# Patient Record
Sex: Male | Born: 1970 | Race: White | Hispanic: No | Smoking: Current every day smoker
Health system: Southern US, Community
[De-identification: ages and names within clinical notes are randomized; demographics above are authoritative.]

## PROBLEM LIST (undated history)

## (undated) DIAGNOSIS — E119 Type 2 diabetes mellitus without complications: Secondary | ICD-10-CM

## (undated) HISTORY — PX: GASTRIC BYPASS: SHX52

---

## 2006-03-12 ENCOUNTER — Emergency Department (HOSPITAL_COMMUNITY): Admission: EM | Admit: 2006-03-12 | Discharge: 2006-03-12 | Payer: Self-pay | Admitting: Emergency Medicine

## 2013-09-30 ENCOUNTER — Encounter (HOSPITAL_COMMUNITY): Payer: Self-pay | Admitting: Emergency Medicine

## 2013-09-30 ENCOUNTER — Emergency Department (HOSPITAL_COMMUNITY)
Admission: EM | Admit: 2013-09-30 | Discharge: 2013-10-01 | Disposition: A | Discharge status: Home / Self Care | Payer: Medicare Other | Attending: Emergency Medicine | Admitting: Emergency Medicine

## 2013-09-30 DIAGNOSIS — M25579 Pain in unspecified ankle and joints of unspecified foot: Secondary | ICD-10-CM | POA: Diagnosis not present

## 2013-09-30 HISTORY — DX: Type 2 diabetes mellitus without complications: E11.9

## 2013-09-30 NOTE — ED Notes (Signed)
Pt states he burned his feet about a month ago, he is a diabetic, and has open ulcer to bottom of right foot and blister to top of right foot.  Ulcer to bottom of left foot.

## 2013-10-01 NOTE — ED Notes (Signed)
Unable to locate pt in all waiting areas x 3 attempts.  

## 2016-05-14 ENCOUNTER — Emergency Department (HOSPITAL_COMMUNITY)
Admission: EM | Admit: 2016-05-14 | Discharge: 2016-05-14 | Disposition: A | Discharge status: Home / Self Care | Payer: Medicare Other | Attending: Emergency Medicine | Admitting: Emergency Medicine

## 2016-05-14 ENCOUNTER — Encounter (HOSPITAL_COMMUNITY): Payer: Self-pay

## 2016-05-14 ENCOUNTER — Emergency Department (HOSPITAL_COMMUNITY): Payer: Medicare Other

## 2016-05-14 DIAGNOSIS — M79671 Pain in right foot: Secondary | ICD-10-CM

## 2016-05-14 DIAGNOSIS — L84 Corns and callosities: Secondary | ICD-10-CM | POA: Diagnosis not present

## 2016-05-14 DIAGNOSIS — E119 Type 2 diabetes mellitus without complications: Secondary | ICD-10-CM | POA: Insufficient documentation

## 2016-05-14 LAB — CBC WITH DIFFERENTIAL/PLATELET
Basophils Absolute: 0 10*3/uL (ref 0.0–0.1)
Basophils Relative: 0 %
EOS ABS: 0.3 10*3/uL (ref 0.0–0.7)
EOS PCT: 3 %
HCT: 43.9 % (ref 39.0–52.0)
Hemoglobin: 14.7 g/dL (ref 13.0–17.0)
LYMPHS ABS: 3.1 10*3/uL (ref 0.7–4.0)
LYMPHS PCT: 37 %
MCH: 29.1 pg (ref 26.0–34.0)
MCHC: 33.5 g/dL (ref 30.0–36.0)
MCV: 86.8 fL (ref 78.0–100.0)
MONOS PCT: 6 %
Monocytes Absolute: 0.5 10*3/uL (ref 0.1–1.0)
Neutro Abs: 4.6 10*3/uL (ref 1.7–7.7)
Neutrophils Relative %: 54 %
PLATELETS: 307 10*3/uL (ref 150–400)
RBC: 5.06 MIL/uL (ref 4.22–5.81)
RDW: 15 % (ref 11.5–15.5)
WBC: 8.5 10*3/uL (ref 4.0–10.5)

## 2016-05-14 LAB — BASIC METABOLIC PANEL
Anion gap: 12 (ref 5–15)
BUN: 10 mg/dL (ref 6–20)
CHLORIDE: 101 mmol/L (ref 101–111)
CO2: 25 mmol/L (ref 22–32)
CREATININE: 0.71 mg/dL (ref 0.61–1.24)
Calcium: 9 mg/dL (ref 8.9–10.3)
GFR calc Af Amer: >60 mL/min (ref >60)
GLUCOSE: 84 mg/dL (ref 65–99)
POTASSIUM: 3.9 mmol/L (ref 3.5–5.1)
SODIUM: 138 mmol/L (ref 135–145)

## 2016-05-14 MED ORDER — TRAMADOL HCL 50 MG PO TABS: 50.0000 mg | ORAL_TABLET | Freq: Four times a day (QID) | ORAL | 0 refills | Status: DC | PRN

## 2016-05-14 MED ORDER — ONDANSETRON HCL 4 MG PO TABS
4.0000 mg | ORAL_TABLET | Freq: Once | ORAL | Status: AC
  Administered 2016-05-14: 4 mg via ORAL
  Filled 2016-05-14: qty 1

## 2016-05-14 MED ORDER — ACETAMINOPHEN 325 MG PO TABS
650.0000 mg | ORAL_TABLET | Freq: Once | ORAL | Status: AC
  Administered 2016-05-14: 650 mg via ORAL
  Filled 2016-05-14: qty 2

## 2016-05-14 MED ORDER — IBUPROFEN 600 MG PO TABS: 600.0000 mg | ORAL_TABLET | Freq: Four times a day (QID) | ORAL | 0 refills | Status: AC | PRN

## 2016-05-14 MED ORDER — CLINDAMYCIN HCL 150 MG PO CAPS
300.0000 mg | ORAL_CAPSULE | Freq: Once | ORAL | Status: AC
  Administered 2016-05-14: 300 mg via ORAL
  Filled 2016-05-14: qty 2

## 2016-05-14 MED ORDER — IBUPROFEN 800 MG PO TABS
800.0000 mg | ORAL_TABLET | Freq: Once | ORAL | Status: AC
  Administered 2016-05-14: 800 mg via ORAL
  Filled 2016-05-14: qty 1

## 2016-05-14 MED ORDER — CLINDAMYCIN HCL 150 MG PO CAPS: 150.0000 mg | ORAL_CAPSULE | Freq: Four times a day (QID) | ORAL | 0 refills | Status: DC

## 2016-05-14 NOTE — ED Notes (Signed)
Pt reports a place on his plantar surface of his L foot for the last year- he states he moved to [place] from [place] and "lost my referrals" for foot surgery- he reports a backwards "tear drop" that interferes with his walking and reports he has had no sleep in one week  He has a strong odor of alcohol to his breath

## 2016-05-14 NOTE — ED Notes (Signed)
To radiology

## 2016-05-14 NOTE — ED Provider Notes (Signed)
[facility] Provider Note   CSN: [id number] Arrival date & time: 05/14/16  1954     History   Chief Complaint Chief Complaint  Patient presents with  . Foot Pain    HPI Jon Chandler is a 46 y.o. male.  Patient is a 46 year old male who presents to the emergency department with a complaint of left foot pain.  The patient states that this problem is been going on for approximately a year. He has been seen by a specialist in the [place] area. He has moved from the [place] area to the [facility] area and is lost his referrals. He states that he has a cyst or a callus under his left foot. He states the foot has had some swelling. And he now has pain that keeps him up for several nights at the time. He states it's almost like having a [place] hornet stuck in your shoe. He has not had any fever or chills. His been no drainage from the area around the cyst. He states that he just feels as though he cannot take the pain any longer and came to the emergency department tonight for evaluation.  It is of note that the patient has a history of diabetes mellitus. He also has a history of morbid obesity. These conditions have been resolved by gastric bypass surgery.      Past Medical History:  Diagnosis Date  . Diabetes mellitus without complication (HCC)     There are no active problems to display for this patient.   Past Surgical History:  Procedure Laterality Date  . GASTRIC BYPASS         Home Medications    Prior to Admission medications   Medication Sig Start Date End Date Taking? Authorizing Provider  clindamycin (CLEOCIN) 150 MG capsule Take 1 capsule (150 mg total) by mouth every 6 (six) hours. 05/14/16   Ivery Quale, PA-C  ibuprofen (ADVIL,MOTRIN) 600 MG tablet Take 1 tablet (600 mg total) by mouth every 6 (six) hours as needed. 05/14/16   Ivery Quale, PA-C  traMADol (ULTRAM) 50 MG tablet Take 1 tablet (50 mg total) by mouth every 6 (six) hours as  needed. 05/14/16   Ivery Quale, PA-C    Family History No family history on file.  Social History Social History  Substance Use Topics  . Smoking status: Never Smoker  . Smokeless tobacco: Never Used  . Alcohol use Yes     Allergies   Penicillins   Review of Systems Review of Systems  Constitutional: Negative for activity change.       All ROS Neg except as noted in HPI  HENT: Negative for nosebleeds.   Eyes: Negative for photophobia and discharge.  Respiratory: Negative for cough, shortness of breath and wheezing.   Cardiovascular: Negative for chest pain and palpitations.  Gastrointestinal: Negative for abdominal pain and blood in stool.  Genitourinary: Negative for dysuria, frequency and hematuria.  Musculoskeletal: Positive for arthralgias. Negative for back pain and neck pain.       Foot pain  Skin: Negative.   Neurological: Negative for dizziness, seizures and speech difficulty.  Psychiatric/Behavioral: Negative for confusion and hallucinations.     Physical Exam Updated Vital Signs BP 132/56 (BP Location: Left Arm)   Pulse 84   Temp 98 F (36.7 C) (Oral)   Resp 16   Ht 6\' 3"  (1.905 m)   Wt 104.3 kg   SpO2 100%   BMI 28.75 kg/m   Physical Exam  Constitutional: He is oriented to person, place, and time. He appears well-developed and well-nourished.  Non-toxic appearance.  HENT:  Head: Normocephalic.  Right Ear: Tympanic membrane and external ear normal.  Left Ear: Tympanic membrane and external ear normal.  Eyes: EOM and lids are normal. Pupils are equal, round, and reactive to light.  Neck: Normal range of motion. Neck supple. Carotid bruit is not present.  Cardiovascular: Normal rate, regular rhythm, normal heart sounds, intact distal pulses and normal pulses.   Pulmonary/Chest: Breath sounds normal. No respiratory distress.  Abdominal: Soft. Bowel sounds are normal. There is no tenderness. There is no guarding.  Musculoskeletal: Normal range of  motion.  There is a callus under the fourth metatarsal phalangeal joint left foot. It is tender to touch. Examination is limited because patient states it is too painful. The dorsalis pedis pulses 2+. The posterior tibial pulses 2+. The Achilles tendon is intact. Capillary refill is less than 2 seconds. There no lesions noted between the toes. There no red streaks going up the left leg.  Lymphadenopathy:       Head (right side): No submandibular adenopathy present.       Head (left side): No submandibular adenopathy present.    He has no cervical adenopathy.  Neurological: He is alert and oriented to person, place, and time. He has normal strength. No cranial nerve deficit or sensory deficit.  Skin: Skin is warm and dry.  Psychiatric: He has a normal mood and affect. His speech is normal.  Nursing note and vitals reviewed.    ED Treatments / Results  Labs (all labs ordered are listed, but only abnormal results are displayed) Labs Reviewed  CBC WITH DIFFERENTIAL/PLATELET  BASIC METABOLIC PANEL    EKG  EKG Interpretation None       Radiology Dg Foot Complete Left  Result Date: 05/14/2016 CLINICAL DATA:  Chronic LEFT foot pain, now worsening. EXAM: LEFT FOOT - COMPLETE 3+ VIEW COMPARISON:  None. FINDINGS: There is no evidence of fracture or dislocation. There is no evidence of arthropathy or other focal bone abnormality. Soft tissues are unremarkable. IMPRESSION: Negative. Electronically Signed   By: Awilda Metroourtnay  Bloomer M.D.   On: 05/14/2016 21:13    Procedures Procedures (including critical care time)  Medications Ordered in ED Medications  ibuprofen (ADVIL,MOTRIN) tablet 800 mg (800 mg Oral Given 05/14/16 2126)  acetaminophen (TYLENOL) tablet 650 mg (650 mg Oral Given 05/14/16 2126)  clindamycin (CLEOCIN) capsule 300 mg (300 mg Oral Given 05/14/16 2125)  ondansetron (ZOFRAN) tablet 4 mg (4 mg Oral Given 05/14/16 2125)     Initial Impression / Assessment and Plan / ED Course  I have  reviewed the triage vital signs and the nursing notes.  Pertinent labs & imaging results that were available during my care of the patient were reviewed by me and considered in my medical decision making (see chart for details).     **I have reviewed nursing notes, vital signs, and all appropriate lab and imaging results for this patient.*  Final Clinical Impressions(s) / ED Diagnoses MDM Patient reports a one-year of problems with his left foot. He has been seen in the past by podiatry in the [place] area. He has since moved to the [facility] area, and "lost his referrals". The patient states that he is here tonight because the cyst or callous is causing him a great deal of pain. The examination is limited, because the patient will not allow me to touch certain areas of the foot,  particularly the callus area. I see no red streaks appreciated. There is no drainage from the callus area. There is good range of motion of the toes. There is no neurovascular compromise. Complete blood count is well within normal limits. The basic metabolic panel is within normal limits. The x-ray of the left foot shows no evidence of fracture, dislocation, foreign body, gas, or signs of advancing infection.  I've advised the patient to see physicians at [facility]. Prescription for clindamycin and ibuprofen haven't been given to the patient to use. A small dose of Ultram also given to assist him with his pain. We discussed the importance of warm tub soaks to his foot. The patient acknowledges understanding of the referral as well as the medications.    Final diagnoses:  Pre-ulcerative corn or callous  Foot pain, right    New Prescriptions New Prescriptions   CLINDAMYCIN (CLEOCIN) 150 MG CAPSULE    Take 1 capsule (150 mg total) by mouth every 6 (six) hours.   IBUPROFEN (ADVIL,MOTRIN) 600 MG TABLET    Take 1 tablet (600 mg total) by mouth every 6 (six) hours as needed.   TRAMADOL (ULTRAM) 50  MG TABLET    Take 1 tablet (50 mg total) by mouth every 6 (six) hours as needed.     Ivery Quale, PA-C 05/14/16 2143    Eber Hong, MD 05/15/16 1014

## 2016-05-14 NOTE — ED Triage Notes (Addendum)
Patient reports of sore to left foot area for months.   Patient states he has not been prescribed any medications for diabetes in 3 years. States he has frequent episdoes of hypoglycemia since his by-pass surgery.

## 2016-05-14 NOTE — ED Notes (Signed)
Pt reports that a minor child with him is his caregiver 24/7 per his report

## 2016-05-14 NOTE — ED Notes (Signed)
Minor to desk to report that pt is in pain. When asked, she states pt has taken nothing for pain

## 2016-05-14 NOTE — ED Notes (Addendum)
Patient transported to and from radiology. 

## 2016-05-14 NOTE — Discharge Instructions (Signed)
The x-ray of your right foot is negative for signs of infection, or foreign body, or bone abnormality. Your complete blood count and basic metabolic chemistry are both within normal limits. Please soak your foot in warm Epsom salt water for 10-15 minutes daily. Please see Dr. Stacie AcresMayer for additional podiatry evaluation and management of this problem. Please use clindamycin and ibuprofen daily. Please take these medicines with food. Use Ultram for more severe pain. This medication may cause drowsiness, please use with caution. Please keep your foot elevated above your waist is much as possible.

## 2017-08-12 IMAGING — DX DG FOOT COMPLETE 3+V*L*
3 series · 3 of 3 positions shown · non-contrast
Comparison: None.

CLINICAL DATA: Chronic LEFT foot pain, now worsening.

EXAM:
LEFT FOOT - COMPLETE 3+ VIEW

[foot ap]
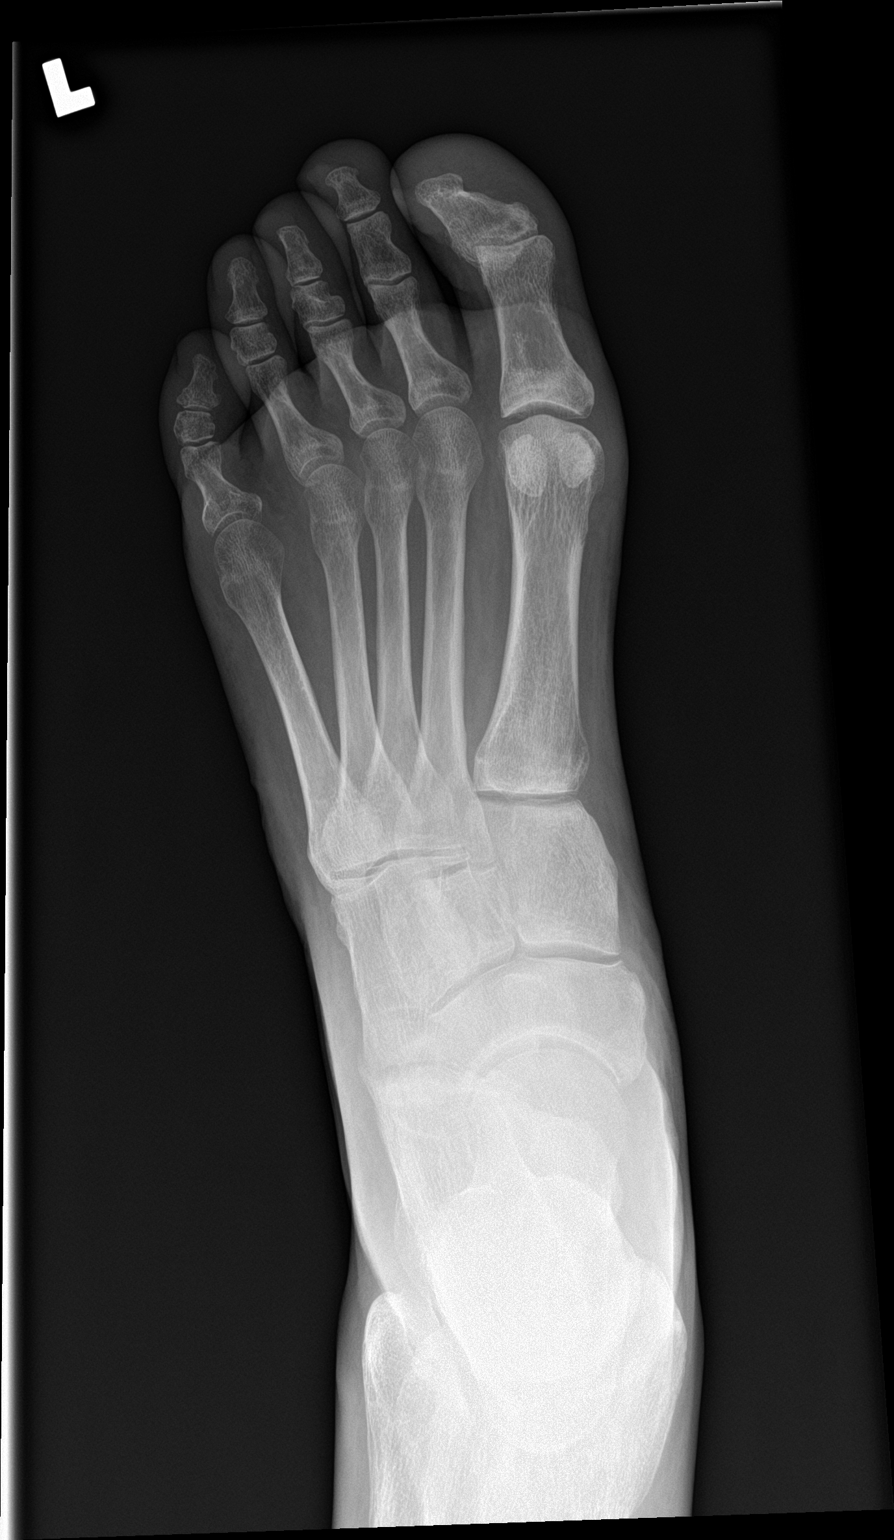

[foot obl]
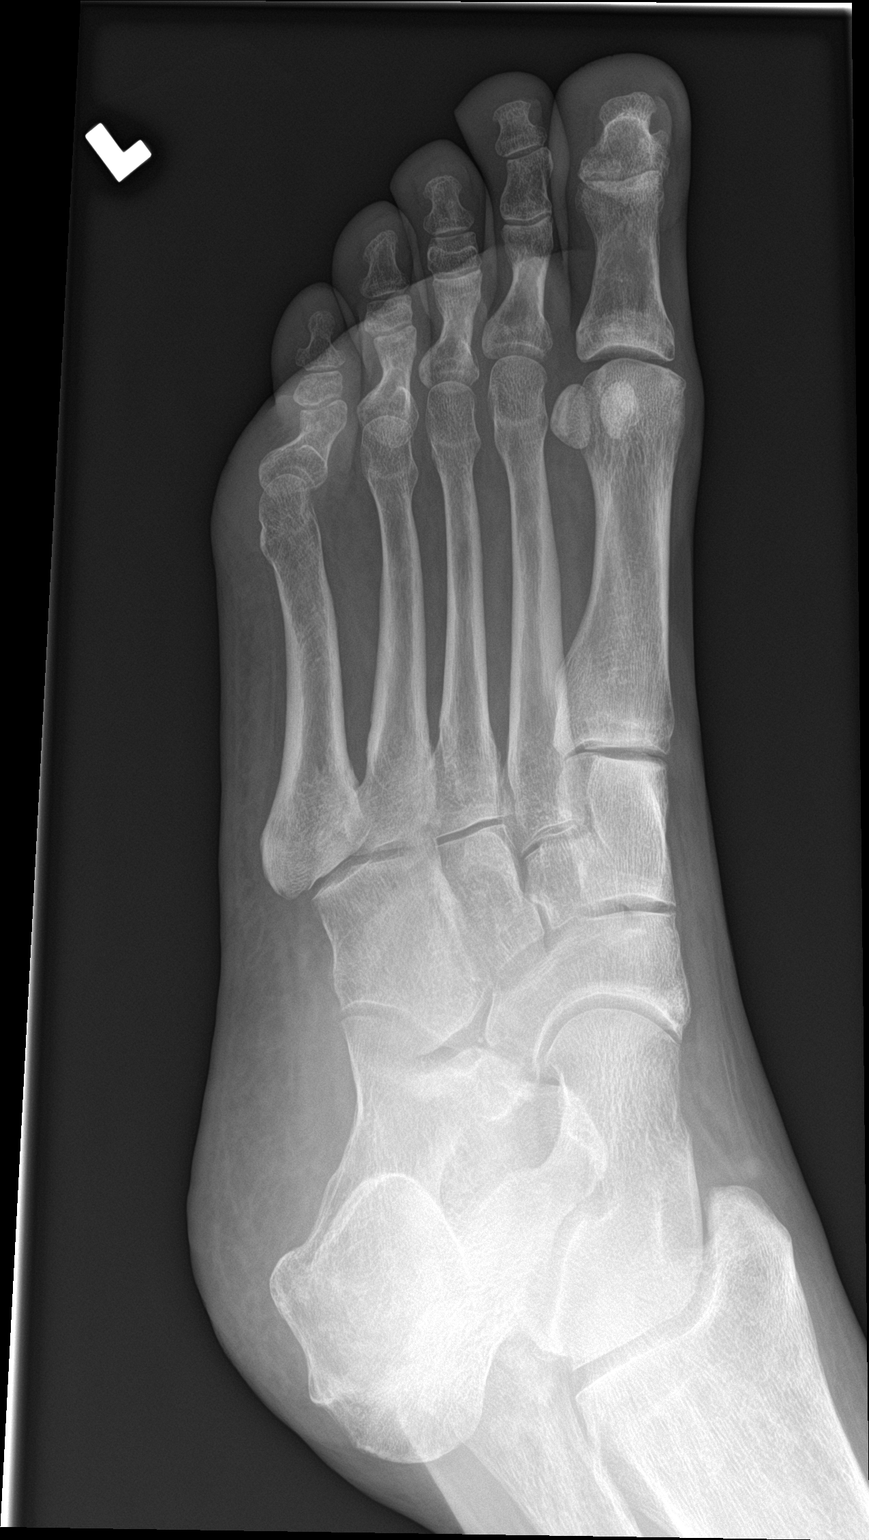

[foot lat]
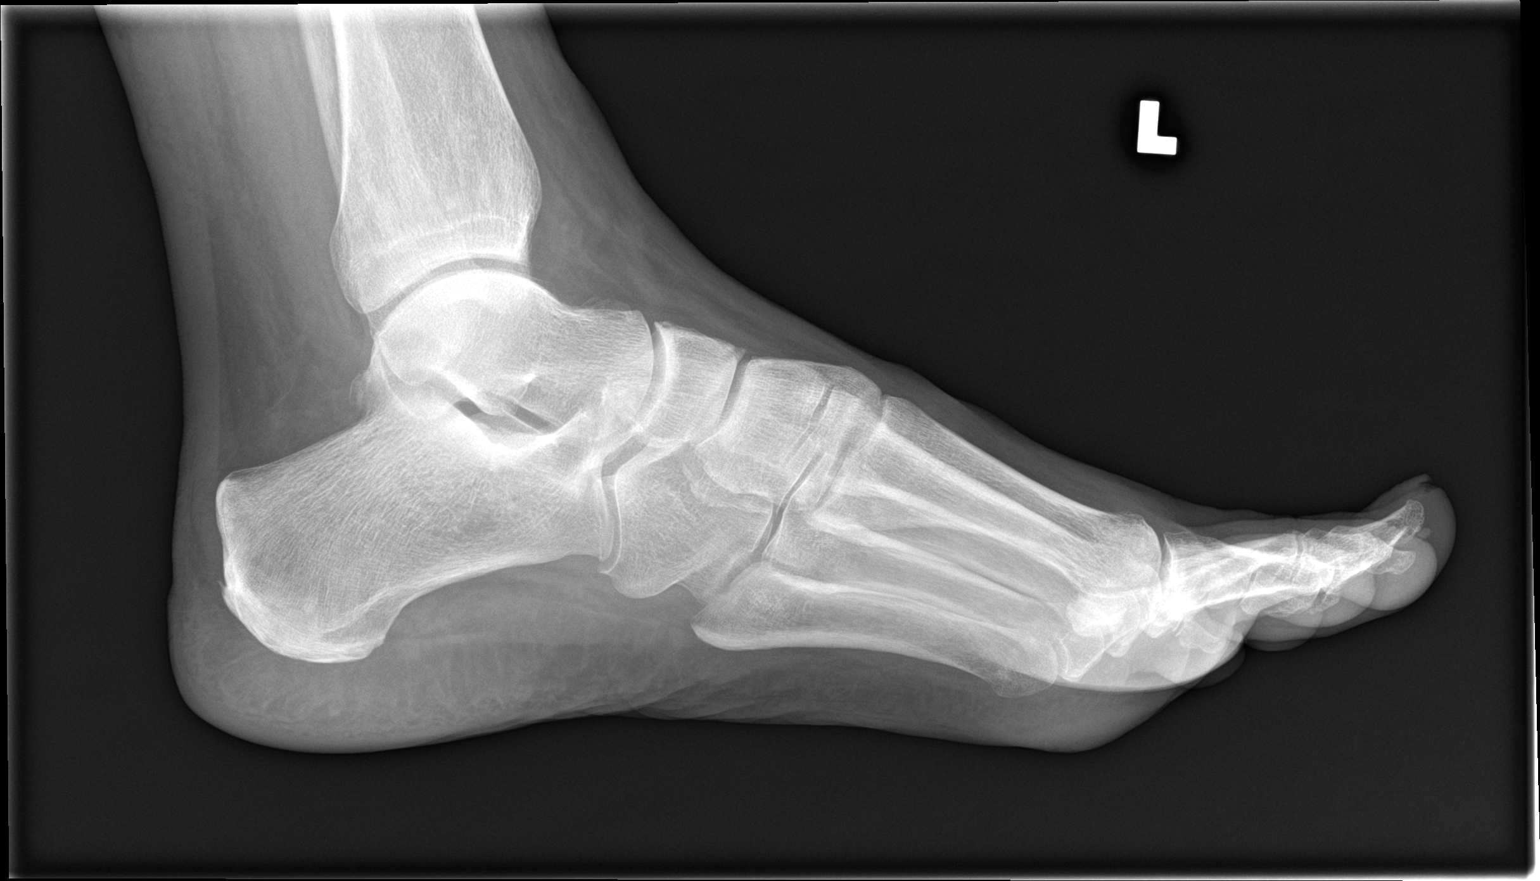

[3 of 3 positions shown; findings below may reference images not displayed]

FINDINGS: There is no evidence of fracture or dislocation. There is no
evidence of arthropathy or other focal bone abnormality. Soft
tissues are unremarkable.
IMPRESSION: Negative.

## 2022-02-17 ENCOUNTER — Ambulatory Visit: Payer: Medicare Other

## 2022-03-29 ENCOUNTER — Other Ambulatory Visit: Payer: Self-pay

## 2022-03-29 ENCOUNTER — Emergency Department (HOSPITAL_COMMUNITY)
Admission: EM | Admit: 2022-03-29 | Discharge: 2022-03-29 | Discharge status: Against Medical Advice | Payer: Medicare Other | Attending: Emergency Medicine | Admitting: Emergency Medicine

## 2022-03-29 ENCOUNTER — Encounter (HOSPITAL_COMMUNITY): Payer: Self-pay | Admitting: Emergency Medicine

## 2022-03-29 ENCOUNTER — Emergency Department (HOSPITAL_COMMUNITY): Payer: Medicare Other

## 2022-03-29 DIAGNOSIS — M79675 Pain in left toe(s): Secondary | ICD-10-CM | POA: Insufficient documentation

## 2022-03-29 DIAGNOSIS — Z4800 Encounter for change or removal of nonsurgical wound dressing: Secondary | ICD-10-CM | POA: Insufficient documentation

## 2022-03-29 MED ORDER — VANCOMYCIN HCL IN DEXTROSE 1-5 GM/200ML-% IV SOLN
1000.0000 mg | Freq: Once | INTRAVENOUS | Status: DC
  Filled 2022-03-29: qty 200

## 2022-03-29 NOTE — ED Notes (Signed)
Triage nurse verbalized that she witnessed pt leaving the facitlity. Charge RN also notified of this.

## 2022-03-29 NOTE — ED Notes (Signed)
Pt not in room or any of the restrooms. Agricultural consultant notified. Attempted to contact pt via cell phone number, could not get an answer or voicemail.

## 2022-03-29 NOTE — ED Triage Notes (Signed)
Pt has wound to left great toe. Has had wound for a few weeks. Pt does not feel like it is getting any better. Redness and drainage noted.

## 2022-03-30 NOTE — ED Provider Notes (Signed)
[facility] Provider Note   CSN: [id number] Arrival date & time: 03/29/22  1014     History  Chief Complaint  Patient presents with   Wound Check    Jon Chandler is a 52 y.o. male.  Patient with swelling to left great toe.  No other medical problem  The history is provided by the patient and medical records. No language interpreter was used.  Wound Check This is a new problem. The current episode started more than 1 week ago. The problem occurs constantly. The problem has not changed since onset.Pertinent negatives include no chest pain, no abdominal pain and no headaches. Nothing aggravates the symptoms. Nothing relieves the symptoms.       Home Medications Prior to Admission medications   Medication Sig Start Date End Date Taking? Authorizing Provider  clindamycin (CLEOCIN) 150 MG capsule Take 1 capsule (150 mg total) by mouth every 6 (six) hours. 05/14/16   Lily Kocher, PA-C  ibuprofen (ADVIL,MOTRIN) 600 MG tablet Take 1 tablet (600 mg total) by mouth every 6 (six) hours as needed. 05/14/16   Lily Kocher, PA-C  traMADol (ULTRAM) 50 MG tablet Take 1 tablet (50 mg total) by mouth every 6 (six) hours as needed. 05/14/16   Lily Kocher, PA-C      Allergies    Penicillins    Review of Systems   Review of Systems  Constitutional:  Negative for appetite change and fatigue.  HENT:  Negative for congestion, ear discharge and sinus pressure.   Eyes:  Negative for discharge.  Respiratory:  Negative for cough.   Cardiovascular:  Negative for chest pain.  Gastrointestinal:  Negative for abdominal pain and diarrhea.  Genitourinary:  Negative for frequency and hematuria.  Musculoskeletal:  Negative for back pain.       Left great toe swelling  Skin:  Negative for rash.  Neurological:  Negative for seizures and headaches.  Psychiatric/Behavioral:  Negative for hallucinations.     Physical Exam Updated Vital Signs BP 112/82    Pulse 92   Temp 98.1 F (36.7 C) (Oral)   Resp 16   Ht 6\' 3"  (1.905 m)   Wt 113.4 kg   SpO2 97%   BMI 31.25 kg/m  Physical Exam Vitals and nursing note reviewed.  Constitutional:      Appearance: He is well-developed.  HENT:     Head: Normocephalic.     Mouth/Throat:     Mouth: Mucous membranes are moist.  Eyes:     General: No scleral icterus.    Conjunctiva/sclera: Conjunctivae normal.  Neck:     Thyroid: No thyromegaly.     Trachea: No tracheal deviation.  Cardiovascular:     Rate and Rhythm: Normal rate and regular rhythm.     Heart sounds: No murmur heard.    No friction rub. No gallop.  Pulmonary:     Breath sounds: No stridor. No wheezing or rales.  Chest:     Chest wall: No tenderness.  Abdominal:     General: There is no distension.     Tenderness: There is no abdominal tenderness. There is no rebound.  Musculoskeletal:        General: Normal range of motion.     Cervical back: Neck supple.     Comments: Patient has a infected swollen left great toe  Lymphadenopathy:     Cervical: No cervical adenopathy.  Skin:    General: Skin is warm.  Findings: No erythema or rash.  Neurological:     Mental Status: He is alert and oriented to person, place, and time.     Motor: No abnormal muscle tone.     Coordination: Coordination normal.  Psychiatric:        Behavior: Behavior normal.     ED Results / Procedures / Treatments   Labs (all labs ordered are listed, but only abnormal results are displayed) Labs Reviewed - No data to display  EKG None  Radiology No results found.  Procedures Procedures    Medications Ordered in ED Medications - No data to display  ED Course/ Medical Decision Making/ A&P                             Medical Decision Making  Patient with infected toe.  He needs lab work and x-rays.  Patient left AMA        Final Clinical Impression(s) / ED Diagnoses Final diagnoses:  None    Rx / DC Orders ED  Discharge Orders     None         Milton Ferguson, MD 03/30/22 1114

## 2022-04-13 ENCOUNTER — Encounter (HOSPITAL_COMMUNITY): Payer: Self-pay

## 2022-04-13 ENCOUNTER — Inpatient Hospital Stay (HOSPITAL_COMMUNITY)
Admission: EM | Admit: 2022-04-13 | Discharge: 2022-04-17 | DRG: 504 | Disposition: A | Discharge status: Home / Self Care | Payer: 59 | Attending: Internal Medicine | Admitting: Internal Medicine

## 2022-04-13 ENCOUNTER — Emergency Department (HOSPITAL_COMMUNITY): Payer: 59

## 2022-04-13 ENCOUNTER — Other Ambulatory Visit: Payer: Self-pay

## 2022-04-13 ENCOUNTER — Inpatient Hospital Stay (HOSPITAL_COMMUNITY): Payer: 59

## 2022-04-13 DIAGNOSIS — M86172 Other acute osteomyelitis, left ankle and foot: Principal | ICD-10-CM | POA: Diagnosis present

## 2022-04-13 DIAGNOSIS — I82411 Acute embolism and thrombosis of right femoral vein: Secondary | ICD-10-CM | POA: Diagnosis present

## 2022-04-13 DIAGNOSIS — E669 Obesity, unspecified: Secondary | ICD-10-CM | POA: Diagnosis present

## 2022-04-13 DIAGNOSIS — M86072 Acute hematogenous osteomyelitis, left ankle and foot: Secondary | ICD-10-CM

## 2022-04-13 DIAGNOSIS — E876 Hypokalemia: Secondary | ICD-10-CM | POA: Diagnosis not present

## 2022-04-13 DIAGNOSIS — I82413 Acute embolism and thrombosis of femoral vein, bilateral: Secondary | ICD-10-CM | POA: Diagnosis not present

## 2022-04-13 DIAGNOSIS — M869 Osteomyelitis, unspecified: Secondary | ICD-10-CM

## 2022-04-13 DIAGNOSIS — Z880 Allergy status to penicillin: Secondary | ICD-10-CM | POA: Diagnosis not present

## 2022-04-13 DIAGNOSIS — M86672 Other chronic osteomyelitis, left ankle and foot: Secondary | ICD-10-CM | POA: Diagnosis not present

## 2022-04-13 DIAGNOSIS — I70202 Unspecified atherosclerosis of native arteries of extremities, left leg: Secondary | ICD-10-CM | POA: Diagnosis not present

## 2022-04-13 DIAGNOSIS — G629 Polyneuropathy, unspecified: Secondary | ICD-10-CM | POA: Diagnosis present

## 2022-04-13 DIAGNOSIS — M7989 Other specified soft tissue disorders: Secondary | ICD-10-CM | POA: Diagnosis not present

## 2022-04-13 DIAGNOSIS — Z6831 Body mass index (BMI) 31.0-31.9, adult: Secondary | ICD-10-CM

## 2022-04-13 DIAGNOSIS — L989 Disorder of the skin and subcutaneous tissue, unspecified: Secondary | ICD-10-CM | POA: Diagnosis not present

## 2022-04-13 DIAGNOSIS — F17200 Nicotine dependence, unspecified, uncomplicated: Secondary | ICD-10-CM | POA: Diagnosis not present

## 2022-04-13 DIAGNOSIS — E119 Type 2 diabetes mellitus without complications: Secondary | ICD-10-CM

## 2022-04-13 DIAGNOSIS — Z9884 Bariatric surgery status: Secondary | ICD-10-CM

## 2022-04-13 DIAGNOSIS — L03116 Cellulitis of left lower limb: Secondary | ICD-10-CM | POA: Diagnosis not present

## 2022-04-13 DIAGNOSIS — E1169 Type 2 diabetes mellitus with other specified complication: Secondary | ICD-10-CM | POA: Diagnosis not present

## 2022-04-13 DIAGNOSIS — L570 Actinic keratosis: Secondary | ICD-10-CM | POA: Diagnosis not present

## 2022-04-13 DIAGNOSIS — L97526 Non-pressure chronic ulcer of other part of left foot with bone involvement without evidence of necrosis: Secondary | ICD-10-CM | POA: Diagnosis present

## 2022-04-13 DIAGNOSIS — E1151 Type 2 diabetes mellitus with diabetic peripheral angiopathy without gangrene: Secondary | ICD-10-CM | POA: Diagnosis not present

## 2022-04-13 DIAGNOSIS — I82462 Acute embolism and thrombosis of left calf muscular vein: Secondary | ICD-10-CM | POA: Diagnosis not present

## 2022-04-13 DIAGNOSIS — E871 Hypo-osmolality and hyponatremia: Secondary | ICD-10-CM | POA: Diagnosis not present

## 2022-04-13 DIAGNOSIS — L308 Other specified dermatitis: Secondary | ICD-10-CM | POA: Diagnosis not present

## 2022-04-13 DIAGNOSIS — R7303 Prediabetes: Secondary | ICD-10-CM | POA: Diagnosis present

## 2022-04-13 DIAGNOSIS — R600 Localized edema: Secondary | ICD-10-CM | POA: Diagnosis not present

## 2022-04-13 DIAGNOSIS — L03119 Cellulitis of unspecified part of limb: Secondary | ICD-10-CM | POA: Diagnosis present

## 2022-04-13 DIAGNOSIS — L089 Local infection of the skin and subcutaneous tissue, unspecified: Secondary | ICD-10-CM | POA: Diagnosis not present

## 2022-04-13 LAB — COMPREHENSIVE METABOLIC PANEL
ALT: 25 U/L (ref 0–44)
AST: 27 U/L (ref 15–41)
Albumin: 3.6 g/dL (ref 3.5–5.0)
Alkaline Phosphatase: 114 U/L (ref 38–126)
Anion gap: 15 (ref 5–15)
BUN: 7 mg/dL (ref 6–20)
CO2: 23 mmol/L (ref 22–32)
Calcium: 10 mg/dL (ref 8.9–10.3)
Chloride: 97 mmol/L — ABNORMAL LOW (ref 98–111)
Creatinine, Ser: 0.66 mg/dL (ref 0.61–1.24)
GFR, Estimated: >60 mL/min (ref >60)
Glucose, Bld: 94 mg/dL (ref 70–99)
Potassium: 3.6 mmol/L (ref 3.5–5.1)
Sodium: 135 mmol/L (ref 135–145)
Total Bilirubin: 0.5 mg/dL (ref 0.3–1.2)
Total Protein: 9.9 g/dL — ABNORMAL HIGH (ref 6.5–8.1)

## 2022-04-13 LAB — HEMOGLOBIN A1C
Hgb A1c MFr Bld: 5.9 % — ABNORMAL HIGH (ref 4.8–5.6)
Mean Plasma Glucose: 122.63 mg/dL

## 2022-04-13 LAB — PROTIME-INR
INR: 1 (ref 0.8–1.2)
Prothrombin Time: 12.6 seconds (ref 11.4–15.2)

## 2022-04-13 LAB — CBC WITH DIFFERENTIAL/PLATELET
Abs Immature Granulocytes: 0.04 10*3/uL (ref 0.00–0.07)
Basophils Absolute: 0 10*3/uL (ref 0.0–0.1)
Basophils Relative: 0 %
Eosinophils Absolute: 0.3 10*3/uL (ref 0.0–0.5)
Eosinophils Relative: 3 %
HCT: 43.7 % (ref 39.0–52.0)
Hemoglobin: 13.4 g/dL (ref 13.0–17.0)
Immature Granulocytes: 0 %
Lymphocytes Relative: 13 %
Lymphs Abs: 1.4 10*3/uL (ref 0.7–4.0)
MCH: 24.3 pg — ABNORMAL LOW (ref 26.0–34.0)
MCHC: 30.7 g/dL (ref 30.0–36.0)
MCV: 79.3 fL — ABNORMAL LOW (ref 80.0–100.0)
Monocytes Absolute: 1 10*3/uL (ref 0.1–1.0)
Monocytes Relative: 9 %
Neutro Abs: 8.5 10*3/uL — ABNORMAL HIGH (ref 1.7–7.7)
Neutrophils Relative %: 75 %
Platelets: 613 10*3/uL — ABNORMAL HIGH (ref 150–400)
RBC: 5.51 MIL/uL (ref 4.22–5.81)
RDW: 20 % — ABNORMAL HIGH (ref 11.5–15.5)
WBC: 11.3 10*3/uL — ABNORMAL HIGH (ref 4.0–10.5)
nRBC: 0 % (ref 0.0–0.2)

## 2022-04-13 LAB — LACTIC ACID, PLASMA
Lactic Acid, Venous: 1.8 mmol/L (ref 0.5–1.9)
Lactic Acid, Venous: 0.9 mmol/L (ref 0.5–1.9)

## 2022-04-13 LAB — GLUCOSE, CAPILLARY
Glucose-Capillary: 123 mg/dL — ABNORMAL HIGH (ref 70–99)
Glucose-Capillary: 160 mg/dL — ABNORMAL HIGH (ref 70–99)

## 2022-04-13 LAB — HIV ANTIBODY (ROUTINE TESTING W REFLEX): HIV Screen 4th Generation wRfx: NONREACTIVE

## 2022-04-13 LAB — MAGNESIUM: Magnesium: 2.2 mg/dL (ref 1.7–2.4)

## 2022-04-13 LAB — PHOSPHORUS: Phosphorus: 3.8 mg/dL (ref 2.5–4.6)

## 2022-04-13 MED ORDER — CLINDAMYCIN PHOSPHATE 600 MG/50ML IV SOLN
600.0000 mg | Freq: Three times a day (TID) | INTRAVENOUS | Status: DC
  Administered 2022-04-13: 600 mg via INTRAVENOUS
  Filled 2022-04-13 (×2): qty 50

## 2022-04-13 MED ORDER — FLEET ENEMA 7-19 GM/118ML RE ENEM: 1.0000 | ENEMA | Freq: Once | RECTAL | Status: DC | PRN

## 2022-04-13 MED ORDER — BISACODYL 5 MG PO TBEC
5.0000 mg | DELAYED_RELEASE_TABLET | Freq: Every day | ORAL | Status: DC | PRN
  Administered 2022-04-16 – 2022-04-17 (×2): 5 mg via ORAL
  Filled 2022-04-13 (×2): qty 1

## 2022-04-13 MED ORDER — NICOTINE 21 MG/24HR TD PT24
21.0000 mg | MEDICATED_PATCH | Freq: Every day | TRANSDERMAL | Status: DC
  Administered 2022-04-13 – 2022-04-17 (×5): 21 mg via TRANSDERMAL
  Filled 2022-04-13 (×6): qty 1

## 2022-04-13 MED ORDER — SODIUM CHLORIDE 0.9% FLUSH: 3.0000 mL | INTRAVENOUS | Status: DC | PRN

## 2022-04-13 MED ORDER — HYDROMORPHONE HCL 1 MG/ML IJ SOLN
1.0000 mg | Freq: Once | INTRAMUSCULAR | Status: AC
  Administered 2022-04-13: 1 mg via INTRAVENOUS
  Filled 2022-04-13: qty 1

## 2022-04-13 MED ORDER — VANCOMYCIN HCL IN DEXTROSE 1-5 GM/200ML-% IV SOLN: 1000.0000 mg | Freq: Once | INTRAVENOUS | Status: DC

## 2022-04-13 MED ORDER — SODIUM CHLORIDE 0.9% FLUSH
3.0000 mL | Freq: Two times a day (BID) | INTRAVENOUS | Status: DC
  Administered 2022-04-13 – 2022-04-15 (×4): 3 mL via INTRAVENOUS

## 2022-04-13 MED ORDER — SODIUM CHLORIDE 0.9 % IV SOLN
2.0000 g | Freq: Once | INTRAVENOUS | Status: AC
  Administered 2022-04-13: 2 g via INTRAVENOUS
  Filled 2022-04-13: qty 20

## 2022-04-13 MED ORDER — IPRATROPIUM BROMIDE 0.02 % IN SOLN: 0.5000 mg | Freq: Four times a day (QID) | RESPIRATORY_TRACT | Status: DC | PRN

## 2022-04-13 MED ORDER — OXYCODONE HCL 5 MG PO TABS
5.0000 mg | ORAL_TABLET | ORAL | Status: DC | PRN
  Administered 2022-04-13 – 2022-04-16 (×10): 5 mg via ORAL
  Filled 2022-04-13 (×10): qty 1

## 2022-04-13 MED ORDER — ACETAMINOPHEN 650 MG RE SUPP: 650.0000 mg | Freq: Four times a day (QID) | RECTAL | Status: DC | PRN

## 2022-04-13 MED ORDER — ZOLPIDEM TARTRATE 5 MG PO TABS
5.0000 mg | ORAL_TABLET | Freq: Every evening | ORAL | Status: DC | PRN
  Administered 2022-04-15 – 2022-04-16 (×2): 5 mg via ORAL
  Filled 2022-04-13 (×2): qty 1

## 2022-04-13 MED ORDER — LEVALBUTEROL HCL 0.63 MG/3ML IN NEBU: 0.6300 mg | INHALATION_SOLUTION | Freq: Four times a day (QID) | RESPIRATORY_TRACT | Status: DC | PRN

## 2022-04-13 MED ORDER — SENNOSIDES-DOCUSATE SODIUM 8.6-50 MG PO TABS: 1.0000 | ORAL_TABLET | Freq: Every evening | ORAL | Status: DC | PRN

## 2022-04-13 MED ORDER — SODIUM CHLORIDE 0.9 % IV SOLN: 250.0000 mL | INTRAVENOUS | Status: DC | PRN

## 2022-04-13 MED ORDER — SODIUM CHLORIDE 0.9% FLUSH
3.0000 mL | Freq: Two times a day (BID) | INTRAVENOUS | Status: DC
  Administered 2022-04-13 – 2022-04-16 (×7): 3 mL via INTRAVENOUS

## 2022-04-13 MED ORDER — VANCOMYCIN HCL 2000 MG/400ML IV SOLN
2000.0000 mg | Freq: Once | INTRAVENOUS | Status: AC
  Administered 2022-04-13: 2000 mg via INTRAVENOUS
  Filled 2022-04-13 (×2): qty 400

## 2022-04-13 MED ORDER — HEPARIN SODIUM (PORCINE) 5000 UNIT/ML IJ SOLN
5000.0000 [IU] | Freq: Three times a day (TID) | INTRAMUSCULAR | Status: DC
  Administered 2022-04-13 – 2022-04-14 (×3): 5000 [IU] via SUBCUTANEOUS
  Filled 2022-04-13 (×4): qty 1

## 2022-04-13 MED ORDER — SODIUM CHLORIDE 0.9 % IV SOLN: 1.0000 g | Freq: Three times a day (TID) | INTRAVENOUS | Status: DC

## 2022-04-13 MED ORDER — SODIUM CHLORIDE 0.9 % IV SOLN: INTRAVENOUS | Status: AC

## 2022-04-13 MED ORDER — CEFAZOLIN SODIUM-DEXTROSE 2-4 GM/100ML-% IV SOLN
2.0000 g | Freq: Three times a day (TID) | INTRAVENOUS | Status: DC
  Administered 2022-04-13 – 2022-04-16 (×9): 2 g via INTRAVENOUS
  Filled 2022-04-13 (×9): qty 100

## 2022-04-13 MED ORDER — INSULIN ASPART 100 UNIT/ML IJ SOLN
0.0000 [IU] | Freq: Three times a day (TID) | INTRAMUSCULAR | Status: DC
  Administered 2022-04-15: 1 [IU] via SUBCUTANEOUS

## 2022-04-13 MED ORDER — HYDROMORPHONE HCL 1 MG/ML IJ SOLN
0.5000 mg | INTRAMUSCULAR | Status: DC | PRN
  Administered 2022-04-13 – 2022-04-14 (×9): 1 mg via INTRAVENOUS
  Administered 2022-04-14: 0.5 mg via INTRAVENOUS
  Administered 2022-04-14 – 2022-04-17 (×21): 1 mg via INTRAVENOUS
  Filled 2022-04-13 (×33): qty 1

## 2022-04-13 MED ORDER — ACETAMINOPHEN 325 MG PO TABS
650.0000 mg | ORAL_TABLET | Freq: Four times a day (QID) | ORAL | Status: DC | PRN
  Administered 2022-04-15: 650 mg via ORAL
  Filled 2022-04-13: qty 2

## 2022-04-13 MED ORDER — PIPERACILLIN-TAZOBACTAM 3.375 G IVPB 30 MIN: 3.3750 g | Freq: Once | INTRAVENOUS | Status: DC

## 2022-04-13 MED ORDER — HYDRALAZINE HCL 20 MG/ML IJ SOLN: 10.0000 mg | INTRAMUSCULAR | Status: DC | PRN

## 2022-04-13 MED ORDER — ONDANSETRON HCL 4 MG/2ML IJ SOLN: 4.0000 mg | Freq: Four times a day (QID) | INTRAMUSCULAR | Status: DC | PRN

## 2022-04-13 MED ORDER — INSULIN ASPART 100 UNIT/ML IJ SOLN: 0.0000 [IU] | Freq: Every day | INTRAMUSCULAR | Status: DC

## 2022-04-13 MED ORDER — ONDANSETRON HCL 4 MG PO TABS: 4.0000 mg | ORAL_TABLET | Freq: Four times a day (QID) | ORAL | Status: DC | PRN

## 2022-04-13 MED ORDER — VANCOMYCIN HCL 1750 MG/350ML IV SOLN
1750.0000 mg | Freq: Two times a day (BID) | INTRAVENOUS | Status: DC
  Administered 2022-04-14 – 2022-04-16 (×5): 1750 mg via INTRAVENOUS
  Filled 2022-04-13 (×9): qty 350

## 2022-04-13 NOTE — Assessment & Plan Note (Signed)
-   Currently not on any medications -Patient states he was told borderline diabetic  - checking CBG q. ACHS, SSI coverage -Diabetic diet -Will check hemoglobin A1c

## 2022-04-13 NOTE — Assessment & Plan Note (Addendum)
-   Cellulitis left foot starting from the toe streaking to the foot bacterial leg up to his thigh area -Completed outpatient treatment with p.o. antibiotics of clindamycin with no improvement and actually worsened -Ruling out osteomyelitis pursuing MRI of the left foot  X-ray of the left foot:  IMPRESSION: 1. New moderate to high-grade fragmentation/erosion of the distal phalanx of the great toe and the distal most aspect of the proximal phalanx of the great toe. Findings are highly suspicious for acute osteomyelitis   -Initiating cellulitis protocol order set, -Initiating broad-spectrum antibiotics of vancomycin and Zosyn -Deceived 2 g of Rocephin in ED

## 2022-04-13 NOTE — Progress Notes (Signed)
Pharmacy Antibiotic Note  Jon Chandler is a 52 y.o. male admitted on 04/13/2022 with cellulitis.  Pharmacy has been consulted for vancomycin and zosyn dosing.  Patient currently afebrile, wbc slightly elevated at 11.3. Renal function is normal.   Vancomycin 1750 mg IV Q 12 hrs. Goal AUC 400-550. Expected AUC: 461 SCr used: 0.66  Patient with noted childhood allergy to penicillins, no recent pcn listed as administered in chart, did tolerate ceftriaxone. Discussed with primary team, will change to meropenem with less cross reactivity.    Plan: Vancomycin 1750 IV every 12 hours.  Goal trough 15-20 mcg/mL. Meropenem 1g q8 hours  Height: 6\' 3"  (190.5 cm) Weight: 113.4 kg (250 lb) IBW/kg (Calculated) : 84.5  Temp (24hrs), Avg:98.6 F (37 C), Min:98.6 F (37 C), Max:98.6 F (37 C)  Recent Labs  Lab 04/13/22 1220 04/13/22 1351  WBC 11.3*  --   CREATININE 0.66  --   LATICACIDVEN 1.8 0.9    Estimated Creatinine Clearance: 146.8 mL/min (by C-G formula based on SCr of 0.66 mg/dL).    Allergies  Allergen Reactions   Penicillins     Other Reaction(s): Other - See Comments  As infant unsure    Thank you for allowing pharmacy to be a part of this patient's care.  Erin Hearing PharmD., BCPS Clinical Pharmacist 04/13/2022 3:16 PM

## 2022-04-13 NOTE — Assessment & Plan Note (Addendum)
Osteomyelitis left great toe -MRI of the foot: MPRESSION: 1. Active osteomyelitis of the proximal and distal phalanges of the great toe with suspected septic interphalangeal joint. 2. Bilobulated 2.4 by 0.7 by 1.0 cm lesion in the subcutaneous tissues of the dorsum of the foot possibly representing thrombosed venous structure or proteinaceous fluid in a cystic lesion.   -Obtaining ABI and venous Doppler studies  -Obtaining blood cultures -Continue broad-spectrum antibiotics meropenem and vancomycin  -Consulting general surgery for further evaluation, -Consulting ID; Dr. Evelina Bucy for evaluation recommendation of antibiotics

## 2022-04-13 NOTE — Progress Notes (Addendum)
ID PROGRESS NOTE  Full consult note to follow:  52yo M with left foot cellulitis, involving streaking up leg. Did not improve on clindamycin. Was started on ceftriaxone.  MRI showing acute osteomyelitis to left great toe with bone destruction and ip joint septic arthritis- with lymphangitis/LN  A/P: cellulitis likely strep species/staph possibly. Does not need pseudomonal gram negative coverage at this time. Recommend to do cefazolin 2gm IV Q 8hr plus vancomycin If MRSA colonization is negative, will consider d/c vancomycin Agree with abi and ortho eval.  Jawuan Robb B. [facility] [phone]

## 2022-04-13 NOTE — Discharge Instructions (Addendum)
Providers Accepting New Patients in [place], [place]. [address] [place], [place] [address]  [phone]  Accepts most insurances  [facility]  [address]  [place], [place] [address]  [phone]  Accepts most insurances  [facility] [address] [place], [place] [address]  [phone]  Must meet requirements  [facility] [address] [place], [place] [address]  [phone]  Accepts most insurances  [facility]  [address]  [place], [place] [address]  [phone]  Accepts most insurances  [facility]  [address]  [place], [place]  [phone]  Accepts most insurances  [facility]  ([place])  [phone] [address]  [place], [place] [address]  [phone]  Accepts most insurances  [facility]  [address]  [place], [place] [address]  [phone]  Accepts most [place], [place] [address]  [phone] option 1  Accepts Medicaid and [facility]  [address]  [place], [place] [id number]  [phone]  Accepts most insurances  Rosita Fire, MD  [place], [place] [address]  [phone]  Accepts most insurances  [facility] [place]. [place], [place] [address]  [phone]  Accepts most insurances  [facility]  [phone] [address] [place], [place] [address]  [phone]  Accepts most insurances  [place], [place]  [address]  [place],  [address]  [phone]  Accepts most insurances   Wound Care Instructions: -Apply antibiotic ointment to the incision site -You can then replace the yellow xeroform gauze (cut a small piece off and put the remaining in a ziplock  to prevent it from drying out).  Cover the Xeroform with 4 x 4's, placed 4 x 4's between the toes, wrap the foot with gauze roll, and then wrapped with Ace wrap -Change dressing daily -If you shower, try to place your foot in a bag and prevent it from getting wet. -You can wipe off your foot with a rag and soap and wipe it dry. -Try to bear weight on your heel. -Use your post operative boot.    Information on my medicine - ELIQUIS (apixaban)  This medication education was reviewed with me or my healthcare representative as part of my discharge preparation.    Why was Eliquis prescribed for you? Eliquis was prescribed to treat blood clots that may have been found in the veins of your legs (deep vein thrombosis) or in your lungs (pulmonary embolism) and to reduce the risk of them occurring again.  What do You need to know about Eliquis ? The starting dose is 10 mg (two 5 mg tablets) taken TWICE daily for the FIRST SEVEN (7) DAYS, then on 04/23/2022 the dose is reduced to ONE 5 mg tablet taken TWICE daily.  Eliquis may be taken with or without food.   Try to take the dose about the same time in the morning and in the evening. If you have difficulty swallowing the tablet whole please discuss with your pharmacist how to take the medication safely.  Take Eliquis exactly as prescribed and DO NOT stop taking Eliquis without talking to the doctor who prescribed the medication.  Stopping may increase your risk of developing a new blood  clot.  Refill your prescription before you run out.  After discharge, you should have regular check-up appointments with your healthcare provider that is prescribing your Eliquis.    What do you do if you miss a dose? If a dose of ELIQUIS is not taken at the scheduled time, take it as soon as possible on the same day and twice-daily administration should be resumed. The dose should not be doubled to make up for a missed dose.  Important Safety Information A  possible side effect of Eliquis is bleeding. You should call your healthcare provider right away if you experience any of the following: Bleeding from an injury or your nose that does not stop. Unusual colored urine (red or dark brown) or unusual colored stools (red or black). Unusual bruising for unknown reasons. A serious fall or if you hit your head (even if there is no bleeding).  Some medicines may interact with Eliquis and might increase your risk of bleeding or clotting while on Eliquis. To help avoid this, consult your healthcare provider or pharmacist prior to using any new prescription or non-prescription medications, including herbals, vitamins, non-steroidal anti-inflammatory drugs (NSAIDs) and supplements.  This website has more information on Eliquis (apixaban): http://www.eliquis.com/eliquis/home

## 2022-04-13 NOTE — Hospital Course (Signed)
Jon Chandler Bianchini-year-old male with extensive history of presumed pain and swelling.  Patient reports That he was treated with a round of antibiotics of clindamycin over past week with no improvement.  Stated erythema edema signs of infection started on her left great toe extending to his foot now down to his leg up to his thigh area. Denies remembering any trauma or open wound to the left foot/toe area. Pain swelling and redness has gotten worse since yesterday.   ED course: Blood pressure (!) 135/95, pulse (!) 107, temperature 98.6 F (37 C), temperature source Oral, resp. rate 20,  SpO2 99 % on RA   CBC: WBC of 11.3, platelets 08/07/2011, chloride 97,  X-ray of the left foot:  IMPRESSION: 1. New moderate to high-grade fragmentation/erosion of the distal phalanx of the great toe and the distal most aspect of the proximal phalanx of the great toe. Findings are highly suspicious for acute osteomyelitis. 2. Moderate to high-grade great toe soft tissue swelling. 3. 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   Requested for patient to be admitted for IV antibiotics for possible cellulitis/osteomyelitis left great toe

## 2022-04-13 NOTE — ED Triage Notes (Signed)
Pt reports he has finished a round of abx for cellulitis to the left foot and leg but has had no improvement.

## 2022-04-13 NOTE — ED Notes (Signed)
Called Charge, Nurse Reynolds to inform her pt for 302 is on the way to the floor

## 2022-04-13 NOTE — H&P (Addendum)
History and Physical   Patient: Jon Chandler                            PCP: Pcp, No                    DOB: 02-06-1971            DOA: 04/13/2022 [id number]             DOS: 04/13/2022, 4:40 PM  Pcp, No  Patient coming from:   HOME  I have personally reviewed patient's medical records, in electronic medical records, including:  [facility], and care everywhere.    Chief Complaint:   Chief Complaint  Patient presents with   Cellulitis    History of present illness:    Jon Chandler male with extensive history of presumed pain and swelling.  Patient reports That he was treated with a round of antibiotics of clindamycin over past week with no improvement.  Stated erythema edema signs of infection started on her left great toe extending to his foot now down to his leg up to his thigh area. Denies remembering any trauma or open wound to the left foot/toe area. Pain swelling and redness has gotten worse since yesterday.   ED course: Blood pressure (!) 135/95, pulse (!) 107, temperature 98.6 F (37 C), temperature source Oral, resp. rate 20,  SpO2 99 % on RA   CBC: WBC of 11.3, platelets 08/07/2011, chloride 97,  X-ray of the left foot:  IMPRESSION: 1. New moderate to high-grade fragmentation/erosion of the distal phalanx of the great toe and the distal most aspect of the proximal phalanx of the great toe. Findings are highly suspicious for acute osteomyelitis. 2. Moderate to high-grade great toe soft tissue swelling. 3. 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   Requested for patient to be admitted for IV antibiotics for possible cellulitis/osteomyelitis left great toe     Patient Denies having: Fever, Chills, Cough, SOB, Chest Pain, Abd pain, N/V/D, headache, dizziness, lightheadedness,  Dysuria,     Review of Systems: As per HPI, otherwise 10 point review of  systems were negative.   ----------------------------------------------------------------------------------------------------------------------  Allergies  Allergen Reactions   Penicillins     Other Reaction(s): Other - See Comments  As infant unsure    Home MEDs:  Prior to Admission medications   Medication Sig Start Date End Date Taking? Authorizing Provider  ibuprofen (ADVIL,MOTRIN) 600 MG tablet Take 1 tablet (600 mg total) by mouth every 6 (six) hours as needed. Patient taking differently: Take 200 mg by mouth every 6 (six) hours as needed for mild pain. 05/14/16  Yes Jon Quale, PA-C    PRN MEDs: sodium chloride, acetaminophen **OR** acetaminophen, bisacodyl, hydrALAZINE, HYDROmorphone (DILAUDID) injection, ipratropium, levalbuterol, ondansetron **OR** ondansetron (ZOFRAN) IV, oxyCODONE, senna-docusate, sodium chloride flush, sodium phosphate, zolpidem  Past Medical History:  Diagnosis Date   Diabetes mellitus without complication (HCC)     Past Surgical History:  Procedure Laterality Date   GASTRIC BYPASS       reports that he has been smoking cigarettes. He has been smoking an average of 1 pack per day. He has never used smokeless tobacco. He reports current alcohol use. He reports that he does not currently use drugs.   History reviewed. No pertinent family history.  Physical Exam:   Vitals:   04/13/22 1300 04/13/22 1330 04/13/22  1622 04/13/22 1622  BP: 122/73 137/85  (!) 129/99  Pulse: 93 91 90 97  Resp:   18   Temp:   99.3 F (37.4 C) 99.3 F (37.4 C)  TempSrc:   Oral Oral  SpO2: 100% 97% 100% 100%  Weight:    110 kg  Height:    6\' 3"  (1.905 m)   Constitutional: NAD, calm, comfortable Eyes: PERRL, lids and conjunctivae normal ENMT: Mucous membranes are moist. Posterior pharynx clear of any exudate or lesions.Normal dentition.  Neck: normal, supple, no masses, no thyromegaly Respiratory: clear to auscultation bilaterally, no wheezing, no crackles.  Normal respiratory effort. No accessory muscle use.  Cardiovascular: Regular rate and rhythm, no murmurs / rubs / gallops. No extremity edema. 2+ pedal pulses. No carotid bruits.  Abdomen: no tenderness, no masses palpated. No hepatosplenomegaly. Bowel sounds positive.  Musculoskeletal: Left lower extremity great toe erythema extending to plantar section of the foot, leg and thigh area-tender range of motion limited no clubbing / cyanosis. No joint deformity upper and lower extremities. Good ROM, no contractures. Normal muscle tone.  Neurologic: CN II-XII grossly intact. Sensation intact, DTR normal. Strength 5/5 in all 4.  Psychiatric: Normal judgment and insight. Alert and oriented x 3. Normal mood.  Skin: no rashes, lesions, ulcers. No induration-, left great toe/foot erythema edema streaking up to leg thigh area         Labs on admission:    I have personally reviewed following labs and imaging studies  CBC: Recent Labs  Lab 04/13/22 1220  WBC 11.3*  NEUTROABS 8.5*  HGB 13.4  HCT 43.7  MCV 79.3*  PLT 413*   Basic Metabolic Panel: Recent Labs  Lab 04/13/22 1220  NA 135  K 3.6  CL 97*  CO2 23  GLUCOSE 94  BUN 7  CREATININE 0.66  CALCIUM 10.0  MG 2.2  PHOS 3.8   GFR: Estimated Creatinine Clearance: 144.7 mL/min (by C-G formula based on SCr of 0.66 mg/dL). Liver Function Tests: Recent Labs  Lab 04/13/22 1220  AST 27  ALT 25  ALKPHOS 114  BILITOT 0.5  PROT 9.9*  ALBUMIN 3.6     Last A1C:  No results found for: "HGBA1C"   Radiologic Exams on Admission:   MR FOOT LEFT WO CONTRAST  Result Date: 04/13/2022 CLINICAL DATA:  Foot infection, query osteomyelitis EXAM: MRI OF THE LEFT FOOT WITHOUT CONTRAST TECHNIQUE: Multiplanar, multisequence MR imaging of the left foot was performed. No intravenous contrast was administered. COMPARISON:  Radiographs 04/13/2022 FINDINGS: Bones/Joint/Cartilage Abnormal edema and bony destructive findings in the proximal and  distal phalanges of the great toe compatible with active osteomyelitis. Probable septic interphalangeal joint with complex joint effusion. Faint and patchy edema signal in the lateral cuneiform and cuboid, likely degenerative. Subtle and likely degenerative marrow edema proximally in the second metatarsal. Ligaments Lisfranc ligament intact Muscles and Tendons Mild regional muscular atrophy. Low-grade diffuse muscular edema is likely neurogenic given the diffuse nature. Soft tissues Diffuse subcutaneous edema especially dorsally along the foot. Bilobulated 2.4 by 0.7 by 1.0 cm lesion in the subcutaneous tissues of the dorsum of the foot (image 25, series 7 with accentuated T1 signal, possibly representing thrombosed venous structure or proteinaceous fluid in a cystic lesion. It would be unusual for an abscess to have high T1 signal although given the surrounding edema and potential cellulitis, a small abscess is difficult to totally rule out. IMPRESSION: 1. Active osteomyelitis of the proximal and distal phalanges of the great toe  with suspected septic interphalangeal joint. 2. Bilobulated 2.4 by 0.7 by 1.0 cm lesion in the subcutaneous tissues of the dorsum of the foot with accentuated T1 signal, possibly representing thrombosed venous structure or proteinaceous fluid in a cystic lesion. It would be unusual for an abscess to have high T1 signal although given the surrounding edema and potential cellulitis, a small abscess is difficult to totally rule out. 3. Diffuse subcutaneous edema especially dorsally along the foot. 4. Low-grade diffuse muscular edema is likely neurogenic given the diffuse nature. Electronically Signed   By: Van Clines M.D.   On: 04/13/2022 14:54   DG Foot Complete Left  Result Date: 04/13/2022 CLINICAL DATA:  Infection. Cellulitis. Redness and swelling of left foot. EXAM: LEFT FOOT - COMPLETE 3+ VIEW COMPARISON:  Left foot radiographs 05/14/2016 FINDINGS: There is moderate to  high-grade great toe soft tissue swelling. There is new moderate to high-grade fragmentation/erosion of the distal phalanx of the great toe. There is also high-grade erosion of the distal most aspect of the adjacent proximal phalanx of the great toe. There is mild diastasis of the cortical fragments. Minimal medial great toe metatarsophalangeal joint space narrowing. There is a prominent anterior process of the calcaneus that appears to articulate with the lateral aspect of the navicular, with mild-to-moderate joint space narrowing, subchondral sclerosis, and peripheral osteophytosis that is greatest at the plantar/lateral aspect of the joint, best seen on oblique view. Minimal dorsal talonavicular and navicular-cuneiform degenerative osteophytes are unchanged from prior. IMPRESSION: 1. New moderate to high-grade fragmentation/erosion of the distal phalanx of the great toe and the distal most aspect of the proximal phalanx of the great toe. Findings are highly suspicious for acute osteomyelitis. 2. Moderate to high-grade great toe soft tissue swelling. 3. 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 Electronically Signed   By: Yvonne Kendall M.D.   On: 04/13/2022 12:17    EKG:   Independently reviewed.  Orders placed or performed during the hospital encounter of 04/13/22   EKG 12-Lead   ---------------------------------------------------------------------------------------------------------------------------------------    Assessment / Plan:   Principal Problem:   Osteomyelitis ([place]) Active Problems:   Lower extremity cellulitis   DMII (diabetes mellitus, type 2) ([place])   Assessment and Plan: * Osteomyelitis ([place]) Osteomyelitis left great toe -MRI of the foot: MPRESSION: 1. Active osteomyelitis of the proximal and distal phalanges of the great toe with suspected septic interphalangeal joint. 2. Bilobulated  2.4 by 0.7 by 1.0 cm lesion in the subcutaneous tissues of the dorsum of the foot possibly representing thrombosed venous structure or proteinaceous fluid in a cystic lesion.   -Obtaining ABI and venous Doppler studies  -Obtaining blood cultures -Continue broad-spectrum antibiotics meropenem and vancomycin  -Consulting general surgery for further evaluation, -Consulting ID; Dr. Evelina Bucy for evaluation recommendation of antibiotics  Lower extremity cellulitis - Cellulitis left foot starting from the toe streaking to the foot bacterial leg up to his thigh area -Completed outpatient treatment with p.o. antibiotics of clindamycin with no improvement and actually worsened -Ruling out osteomyelitis pursuing MRI of the left foot  X-ray of the left foot:  IMPRESSION: 1. New moderate to high-grade fragmentation/erosion of the distal phalanx of the great toe and the distal most aspect of the proximal phalanx of the great toe. Findings are highly suspicious for acute osteomyelitis   -Initiating cellulitis protocol order set, -Initiating broad-spectrum antibiotics of vancomycin and Zosyn -Deceived 2 g of Rocephin in ED  DMII (diabetes mellitus, type 2) (  [place]) - Currently not on any medications -Patient states he was told borderline diabetic  - checking CBG q. ACHS, SSI coverage -Diabetic diet -Will check hemoglobin A1c      Consults called:  None -------------------------------------------------------------------------------------------------------------------------------------------- DVT prophylaxis:  heparin injection 5,000 Units Start: 04/13/22 1415 TED hose Start: 04/13/22 1411 SCDs Start: 04/13/22 1411   Code Status:   Code Status: Full Code   Admission status: Patient will be admitted as Inpatient, with a greater than 2 midnight length of stay. Level of care: Med-Surg   Family Communication:  none at bedside  (The above findings and plan of care has been discussed with  patient in detail, the patient expressed understanding and agreement of above plan)  --------------------------------------------------------------------------------------------------------------------------------------------------  Disposition Plan:  Anticipated 1-2 days Status is: Inpatient Remains inpatient appropriate because: Needing IV antibiotics for treatment of cellulitis, osteomyelitis, failed outpatient treatment     ----------------------------------------------------------------------------------------------------------------------------------------------------  Time spent: > than  22  Min.  Was spent seeing and evaluating the patient, reviewing all medical records, drawn plan of care.  SIGNED: Deatra James, MD, FHM. FAAFP. [place] [facility], Pager  (Please use [email or web address] to page/ or secure chat through epic) If 7PM-7AM, please contact night-coverage [email or web address],  04/13/2022, 4:40 PM For on call review [email or web address].

## 2022-04-13 NOTE — ED Provider Notes (Signed)
[facility] Provider Note   CSN: [id number] Arrival date & time: 04/13/22  1056     History  Chief Complaint  Patient presents with   Cellulitis    Jon Chandler is a 52 y.o. male.  Pt complains of a foot infection that started in his toe and now goes up his leg.  Pt has a streak going up to groin and swollen lymph nodes.  Pt reports swelling he has been on oral antibiotics.  Pt reports swelling increased yesterday.  Pt has a history of diabetes but is not on any medications.  Pt denies fever.    The history is provided by the patient. No language interpreter was used.       Home Medications Prior to Admission medications   Medication Sig Start Date End Date Taking? Authorizing Provider  clindamycin (CLEOCIN) 150 MG capsule Take 1 capsule (150 mg total) by mouth every 6 (six) hours. 05/14/16   Lily Kocher, PA-C  ibuprofen (ADVIL,MOTRIN) 600 MG tablet Take 1 tablet (600 mg total) by mouth every 6 (six) hours as needed. 05/14/16   Lily Kocher, PA-C  traMADol (ULTRAM) 50 MG tablet Take 1 tablet (50 mg total) by mouth every 6 (six) hours as needed. 05/14/16   Lily Kocher, PA-C      Allergies    Penicillins    Review of Systems   Review of Systems  Constitutional:  Negative for fatigue and fever.  Gastrointestinal:  Negative for nausea.  Musculoskeletal:  Negative for joint swelling.  All other systems reviewed and are negative.   Physical Exam Updated Vital Signs BP (!) 135/95 (BP Location: Right Arm)   Pulse (!) 107   Temp 98.6 F (37 C) (Oral)   Resp 20   Ht 6\' 3"  (1.905 m)   Wt 113.4 kg   SpO2 99%   BMI 31.25 kg/m  Physical Exam Vitals and nursing note reviewed.  Constitutional:      Appearance: He is well-developed.  HENT:     Head: Normocephalic.  Cardiovascular:     Rate and Rhythm: Normal rate.  Pulmonary:     Effort: Pulmonary effort is normal.  Abdominal:     General: There is no distension.   Musculoskeletal:        General: Swelling and tenderness present. Normal range of motion.     Cervical back: Normal range of motion.     Comments: Redness right lower leg, streaking up leg,    Skin:    General: Skin is warm.  Neurological:     General: No focal deficit present.     Mental Status: He is alert and oriented to person, place, and time.  Psychiatric:        Mood and Affect: Mood normal.     ED Results / Procedures / Treatments   Labs (all labs ordered are listed, but only abnormal results are displayed) Labs Reviewed  CULTURE, BLOOD (ROUTINE X 2)  CULTURE, BLOOD (ROUTINE X 2)  CBC WITH DIFFERENTIAL/PLATELET  COMPREHENSIVE METABOLIC PANEL  LACTIC ACID, PLASMA  LACTIC ACID, PLASMA    EKG None  Radiology DG Foot Complete Left  Result Date: 04/13/2022 CLINICAL DATA:  Infection. Cellulitis. Redness and swelling of left foot. EXAM: LEFT FOOT - COMPLETE 3+ VIEW COMPARISON:  Left foot radiographs 05/14/2016 FINDINGS: There is moderate to high-grade great toe soft tissue swelling. There is new moderate to high-grade fragmentation/erosion of the distal phalanx of the great toe. There  is also high-grade erosion of the distal most aspect of the adjacent proximal phalanx of the great toe. There is mild diastasis of the cortical fragments. Minimal medial great toe metatarsophalangeal joint space narrowing. There is a prominent anterior process of the calcaneus that appears to articulate with the lateral aspect of the navicular, with mild-to-moderate joint space narrowing, subchondral sclerosis, and peripheral osteophytosis that is greatest at the plantar/lateral aspect of the joint, best seen on oblique view. Minimal dorsal talonavicular and navicular-cuneiform degenerative osteophytes are unchanged from prior. IMPRESSION: 1. New moderate to high-grade fragmentation/erosion of the distal phalanx of the great toe and the distal most aspect of the proximal phalanx of the great toe.  Findings are highly suspicious for acute osteomyelitis. 2. Moderate to high-grade great toe soft tissue swelling. 3. 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 Electronically Signed   By: Yvonne Kendall M.D.   On: 04/13/2022 12:17    Procedures Procedures    Medications Ordered in ED Medications  cefTRIAXone (ROCEPHIN) 2 g in sodium chloride 0.9 % 100 mL IVPB (has no administration in time range)    ED Course/ Medical Decision Making/ A&P                             Medical Decision Making Pt complains of redness and swelling to left foot,   Amount and/or Complexity of Data Reviewed Independent Historian: spouse    Details: Pt here with wife who is supportive  Labs: ordered. Decision-making details documented in ED Course.    Details: Labs ordered reviewed and interpreted.  Pt has a wbc count of 11.3  Radiology: ordered and independent interpretation performed. Decision-making details documented in ED Course.    Details: Xray shows probable new osteomyelitis  Discussion of management or test interpretation with external provider(s): I discussed with Hospitalist who will see for admission.  Hospitalist request MRi for evaluation   Risk Decision regarding hospitalization. Risk Details: Pt given Rocephin 2 grams IV.             Final Clinical Impression(s) / ED Diagnoses Final diagnoses:  Cellulitis of left leg    Rx / DC Orders ED Discharge Orders     None         Sidney Ace 04/13/22 [place], MD 04/14/22 [phone]

## 2022-04-13 NOTE — Progress Notes (Signed)
  Transition of Care [facility]) Screening Note   Patient Details  Name: Jon Chandler Date of Birth: 08/14/1970   Transition of Care [facility]) CM/SW Contact:    Iona Beard, [place] Phone Number: 04/13/2022, 3:38 PM  TOC consulted for PCP needs. CSW added PCP resource list to pts AVS which will be printed at D/C. Consult also for HH/DME needs, TOC to follow for PT recommendations if any made.   Transition of Care Department [facility]) has reviewed patient and no TOC needs have been identified at this time. We will continue to monitor patient advancement through interdisciplinary progression rounds. If new patient transition needs arise, please place a TOC consult.

## 2022-04-14 ENCOUNTER — Inpatient Hospital Stay (HOSPITAL_COMMUNITY): Payer: 59

## 2022-04-14 ENCOUNTER — Other Ambulatory Visit (HOSPITAL_COMMUNITY): Payer: Self-pay

## 2022-04-14 DIAGNOSIS — M86072 Acute hematogenous osteomyelitis, left ankle and foot: Secondary | ICD-10-CM | POA: Diagnosis not present

## 2022-04-14 LAB — GLUCOSE, CAPILLARY
Glucose-Capillary: 78 mg/dL (ref 70–99)
Glucose-Capillary: 86 mg/dL (ref 70–99)
Glucose-Capillary: 107 mg/dL — ABNORMAL HIGH (ref 70–99)
Glucose-Capillary: 91 mg/dL (ref 70–99)

## 2022-04-14 LAB — CBC
HCT: 32.8 % — ABNORMAL LOW (ref 39.0–52.0)
Hemoglobin: 10.1 g/dL — ABNORMAL LOW (ref 13.0–17.0)
MCH: 24.6 pg — ABNORMAL LOW (ref 26.0–34.0)
MCHC: 30.8 g/dL (ref 30.0–36.0)
MCV: 80 fL (ref 80.0–100.0)
Platelets: 498 10*3/uL — ABNORMAL HIGH (ref 150–400)
RBC: 4.1 MIL/uL — ABNORMAL LOW (ref 4.22–5.81)
RDW: 19.4 % — ABNORMAL HIGH (ref 11.5–15.5)
WBC: 9 10*3/uL (ref 4.0–10.5)
nRBC: 0 % (ref 0.0–0.2)

## 2022-04-14 LAB — SEDIMENTATION RATE: Sed Rate: 80 mm/hr — ABNORMAL HIGH (ref 0–16)

## 2022-04-14 LAB — BASIC METABOLIC PANEL
Anion gap: 11 (ref 5–15)
BUN: 7 mg/dL (ref 6–20)
CO2: 25 mmol/L (ref 22–32)
Calcium: 8.7 mg/dL — ABNORMAL LOW (ref 8.9–10.3)
Chloride: 96 mmol/L — ABNORMAL LOW (ref 98–111)
Creatinine, Ser: 0.59 mg/dL — ABNORMAL LOW (ref 0.61–1.24)
GFR, Estimated: >60 mL/min (ref >60)
Glucose, Bld: 90 mg/dL (ref 70–99)
Potassium: 3.4 mmol/L — ABNORMAL LOW (ref 3.5–5.1)
Sodium: 132 mmol/L — ABNORMAL LOW (ref 135–145)

## 2022-04-14 LAB — C-REACTIVE PROTEIN: CRP: 16.7 mg/dL — ABNORMAL HIGH (ref <1.0)

## 2022-04-14 LAB — SURGICAL PCR SCREEN
MRSA, PCR: NEGATIVE
Staphylococcus aureus: NEGATIVE

## 2022-04-14 LAB — PROTIME-INR
INR: 1 (ref 0.8–1.2)
Prothrombin Time: 13.1 seconds (ref 11.4–15.2)

## 2022-04-14 LAB — APTT: aPTT: 33 seconds (ref 24–36)

## 2022-04-14 MED ORDER — CHLORHEXIDINE GLUCONATE CLOTH 2 % EX PADS
6.0000 | MEDICATED_PAD | Freq: Once | CUTANEOUS | Status: AC
  Administered 2022-04-14: 6 via TOPICAL

## 2022-04-14 MED ORDER — CHLORHEXIDINE GLUCONATE CLOTH 2 % EX PADS
6.0000 | MEDICATED_PAD | Freq: Once | CUTANEOUS | Status: AC
  Administered 2022-04-15: 6 via TOPICAL

## 2022-04-14 MED ORDER — ENOXAPARIN SODIUM 120 MG/0.8ML IJ SOSY
120.0000 mg | PREFILLED_SYRINGE | Freq: Two times a day (BID) | INTRAMUSCULAR | Status: DC
  Administered 2022-04-15: 120 mg via SUBCUTANEOUS
  Filled 2022-04-14: qty 0.8

## 2022-04-14 MED ORDER — POTASSIUM CHLORIDE CRYS ER 20 MEQ PO TBCR
40.0000 meq | EXTENDED_RELEASE_TABLET | Freq: Once | ORAL | Status: AC
  Administered 2022-04-14: 40 meq via ORAL
  Filled 2022-04-14: qty 2

## 2022-04-14 MED ORDER — ENOXAPARIN SODIUM 120 MG/0.8ML IJ SOSY
120.0000 mg | PREFILLED_SYRINGE | INTRAMUSCULAR | Status: AC
  Administered 2022-04-14: 120 mg via SUBCUTANEOUS
  Filled 2022-04-14: qty 0.8

## 2022-04-14 NOTE — Progress Notes (Signed)
OT Cancellation Note  Patient Details Name: Jon Chandler MRN: [id number] DOB: 1970/11/12   Cancelled Treatment:    Reason Eval/Treat Not Completed: Medical issues which prohibited therapy. Pt on hold until cleared for DVTs via ultrasound. Will attempt to see pt later if medically ready.   Sahara Fujimoto OT, MOT   Larey Seat 04/14/2022, 10:17 AM

## 2022-04-14 NOTE — Progress Notes (Signed)
PROGRESS NOTE    Jon Chandler  OAC:[id number] DOB: 1971-02-08 DOA: 04/13/2022 PCP: Jon Chandler, No   Brief Narrative:    Jon Chandler male with extensive history of presumed pain and swelling.  Patient reports That he was treated with a round of antibiotics of clindamycin over past week with no improvement.  Stated erythema edema signs of infection started on her left great toe extending to his foot now down to his leg up to his thigh area. He has been admitted with osteomyelitis of his left great toe and ID is recommending treatment empirically with cefazolin and vancomycin for now.  ABI is pending as well as further general surgery evaluation.  Assessment & Plan:   Principal Problem:   Osteomyelitis (HCC) Active Problems:   Lower extremity cellulitis   DMII (diabetes mellitus, type 2) (HCC)  Assessment and Plan:   Osteomyelitis (HCC) Osteomyelitis left great toe -MRI of the foot: MPRESSION: 1. Active osteomyelitis of the proximal and distal phalanges of the great toe with suspected septic interphalangeal joint. 2. Bilobulated 2.4 by 0.7 by 1.0 cm lesion in the subcutaneous tissues of the dorsum of the foot possibly representing thrombosed venous structure or proteinaceous fluid in a cystic lesion.    -Obtaining ABI pending   -Obtaining blood cultures -Continue broad-spectrum antibiotics meropenem and vancomycin   -Consulting general surgery for further evaluation, -Consulting ID; Dr. Marcha Dutton for evaluation recommendation of antibiotics   Lower extremity cellulitis - Cellulitis left foot starting from the toe streaking to the foot bacterial leg up to his thigh area -Completed outpatient treatment with p.o. antibiotics of clindamycin with no improvement and actually worsened -Ruling out osteomyelitis pursuing MRI of the left foot   X-ray of the left foot:  IMPRESSION: 1. New moderate to high-grade fragmentation/erosion of the distal phalanx of the great  toe and the distal most aspect of the proximal phalanx of the great toe. Findings are highly suspicious for acute osteomyelitis     -Initiating cellulitis protocol order set, -Initiating broad-spectrum antibiotics of vancomycin and Zosyn -Deceived 2 g of Rocephin in ED  Bilateral lower extremity DVT -Noted on Doppler studies 2/7 -Started on full dose Lovenox 2/7   DMII (diabetes mellitus, type 2) (HCC) - Currently not on any medications -Patient states he was told borderline diabetic  - checking CBG q. ACHS, SSI coverage -Diabetic diet -hemoglobin A1c 5.9%  Mild hypokalemia Replete and reevaluate in a.m.  Obesity BMI 31.03    DVT prophylaxis: Full dose Lovenox Code Status: Full Family Communication: Spouse at bedside Disposition Plan:  Status is: Inpatient Remains inpatient appropriate because: Need for IV medications   Consultants:  ID General surgery  Procedures:  None  Antimicrobials:  Anti-infectives (From admission, onward)    Start     Dose/Rate Route Frequency Ordered Stop   04/14/22 0400  vancomycin (VANCOREADY) IVPB 1750 mg/350 mL        1,750 mg 175 mL/hr over 120 Minutes Intravenous Every 12 hours 04/13/22 1451     04/13/22 1700  meropenem (MERREM) 1 g in sodium chloride 0.9 % 100 mL IVPB  Status:  Discontinued        1 g 200 mL/hr over 30 Minutes Intravenous Every 8 hours 04/13/22 1503 04/13/22 1640   04/13/22 1700  ceFAZolin (ANCEF) IVPB 2g/100 mL premix        2 g 200 mL/hr over 30 Minutes Intravenous Every 8 hours 04/13/22 1640     04/13/22 1500  clindamycin (CLEOCIN) IVPB 600 mg  Status:  Discontinued        600 mg 100 mL/hr over 30 Minutes Intravenous Every 8 hours 04/13/22 1412 04/13/22 1640   04/13/22 1415  vancomycin (VANCOCIN) IVPB 1000 mg/200 mL premix  Status:  Discontinued        1,000 mg 200 mL/hr over 60 Minutes Intravenous  Once 04/13/22 1412 04/13/22 1413   04/13/22 1415  piperacillin-tazobactam (ZOSYN) IVPB 3.375 g  Status:   Discontinued        3.375 g 100 mL/hr over 30 Minutes Intravenous  Once 04/13/22 1412 04/13/22 1503   04/13/22 1415  vancomycin (VANCOREADY) IVPB 2000 mg/400 mL        2,000 mg 200 mL/hr over 120 Minutes Intravenous  Once 04/13/22 1413 04/13/22 1842   04/13/22 1200  cefTRIAXone (ROCEPHIN) 2 g in sodium chloride 0.9 % 100 mL IVPB        2 g 200 mL/hr over 30 Minutes Intravenous  Once 04/13/22 1145 04/13/22 1312      Subjective: Patient seen and evaluated today with no new acute complaints or concerns. No acute concerns or events noted overnight.  Continues to have bilateral lower extremity pain, but erythema improving.  Objective: Vitals:   04/14/22 0125 04/14/22 0438 04/14/22 0500 04/14/22 0800  BP: 123/75 129/85  117/89  Pulse: 82 78  80  Resp: 18 18    Temp: 98.4 F (36.9 C) 97.6 F (36.4 C)  98.2 F (36.8 C)  TempSrc: Oral   Oral  SpO2: 100% 100%  100%  Weight:   112.6 kg   Height:        Intake/Output Summary (Last 24 hours) at 04/14/2022 1141 Last data filed at 04/14/2022 0600 Gross per 24 hour  Intake 1560.39 ml  Output 500 ml  Net 1060.39 ml   Filed Weights   04/13/22 1122 04/13/22 1622 04/14/22 0500  Weight: 113.4 kg 110 kg 112.6 kg    Examination:  General exam: Appears calm and comfortable  Respiratory system: Clear to auscultation. Respiratory effort normal. Cardiovascular system: S1 & S2 heard, RRR.  Gastrointestinal system: Abdomen is soft Central nervous system: Alert and awake Extremities: Bilateral lower extremity edema and erythema-improving Skin: No significant lesions noted Psychiatry: Flat affect.    Data Reviewed: I have personally reviewed following labs and imaging studies  CBC: Recent Labs  Lab 04/13/22 1220 04/14/22 0436  WBC 11.3* 9.0  NEUTROABS 8.5*  --   HGB 13.4 10.1*  HCT 43.7 32.8*  MCV 79.3* 80.0  PLT 613* 409*   Basic Metabolic Panel: Recent Labs  Lab 04/13/22 1220 04/14/22 0436  NA 135 132*  K 3.6 3.4*  CL 97*  96*  CO2 23 25  GLUCOSE 94 90  BUN 7 7  CREATININE 0.66 0.59*  CALCIUM 10.0 8.7*  MG 2.2  --   PHOS 3.8  --    GFR: Estimated Creatinine Clearance: 146.2 mL/min (A) (by C-G formula based on SCr of 0.59 mg/dL (L)). Liver Function Tests: Recent Labs  Lab 04/13/22 1220  AST 27  ALT 25  ALKPHOS 114  BILITOT 0.5  PROT 9.9*  ALBUMIN 3.6   No results for input(s): "LIPASE", "AMYLASE" in the last 168 hours. No results for input(s): "AMMONIA" in the last 168 hours. Coagulation Profile: Recent Labs  Lab 04/13/22 1220 04/14/22 0436  INR 1.0 1.0   Cardiac Enzymes: No results for input(s): "CKTOTAL", "CKMB", "CKMBINDEX", "TROPONINI" in the last 168 hours. BNP (last 3 results) No results for input(s): "PROBNP" in  the last 8760 hours. HbA1C: Recent Labs    04/13/22 1220  HGBA1C 5.9*   CBG: Recent Labs  Lab 04/13/22 1704 04/13/22 2153 04/14/22 0804  GLUCAP 123* 160* 78   Lipid Profile: No results for input(s): "CHOL", "HDL", "LDLCALC", "TRIG", "CHOLHDL", "LDLDIRECT" in the last 72 hours. Thyroid Function Tests: No results for input(s): "TSH", "T4TOTAL", "FREET4", "T3FREE", "THYROIDAB" in the last 72 hours. Anemia Panel: No results for input(s): "VITAMINB12", "FOLATE", "FERRITIN", "TIBC", "IRON", "RETICCTPCT" in the last 72 hours. Sepsis Labs: Recent Labs  Lab 04/13/22 1220 04/13/22 1351  LATICACIDVEN 1.8 0.9    No results found for this or any previous visit (from the past 240 hour(s)).       Radiology Studies: US Venous Img Lower Bilateral (DVT)  Result Date: 04/14/2022 CLINICAL DATA:  9965 Edema 9965 [id number] Pain 144615 EXAM: BILATERAL LOWER EXTREMITY VENOUS DOPPLER ULTRASOUND TECHNIQUE: Gray-scale sonography with graded compression, as well as color Doppler and duplex ultrasound were performed to evaluate the lower extremity deep venous systems from the level of the common femoral vein and including the common femoral, femoral, profunda femoral, popliteal and  calf veins including the posterior tibial, peroneal and gastrocnemius veins when visible. The superficial great saphenous vein was also interrogated. Spectral Doppler was utilized to evaluate flow at rest and with distal augmentation maneuvers in the common femoral, femoral and popliteal veins. COMPARISON:  LEFT foot XRs, concurrent. FINDINGS: RIGHT LOWER EXTREMITY VENOUS Heterogeneously hypoechoic filling defect within the imaged portions of peripheral femoral vein and the paired peroneal veins, with incomplete compressibility. Normal compressibility of the RIGHT common femoral, popliteal, as well as the visualized portions of profunda femoral and great saphenous veins. OTHER No evidence of superficial thrombophlebitis or abnormal fluid collection. Limitations: none LEFT LOWER EXTREMITY VENOUS Heterogeneously hypoechoic filling defect within the imaged portions of 1 of the paired posterior tibial and peroneal veins, with incomplete compressibility. Normal compressibility of the LEFT common femoral, superficial femoral, and popliteal veins. Visualized portions of profunda femoral vein and great saphenous vein unremarkable. OTHER No evidence of superficial thrombophlebitis or abnormal fluid collection. Limitations: none Pathologically-enlarged LEFT inguinal lymph node. IMPRESSION: 1. Examination is POSITIVE for bilateral lower extremity DVT within the RIGHT femoral vein and LEFT calf veins, as above. 2. No evidence of superficial thrombophlebitis within either lower extremity. 3. Pathologically-enlarged LEFT inguinal lymph node, likely reactive. Michaelle Birks, MD [facility] Radiology Electronically Signed   By: Michaelle Birks M.D.   On: 04/14/2022 10:34   MR FOOT LEFT WO CONTRAST  Result Date: 04/13/2022 CLINICAL DATA:  Foot infection, query osteomyelitis EXAM: MRI OF THE LEFT FOOT WITHOUT CONTRAST TECHNIQUE: Multiplanar, multisequence MR imaging of the left foot was  performed. No intravenous contrast was administered. COMPARISON:  Radiographs 04/13/2022 FINDINGS: Bones/Joint/Cartilage Abnormal edema and bony destructive findings in the proximal and distal phalanges of the great toe compatible with active osteomyelitis. Probable septic interphalangeal joint with complex joint effusion. Faint and patchy edema signal in the lateral cuneiform and cuboid, likely degenerative. Subtle and likely degenerative marrow edema proximally in the second metatarsal. Ligaments Lisfranc ligament intact Muscles and Tendons Mild regional muscular atrophy. Low-grade diffuse muscular edema is likely neurogenic given the diffuse nature. Soft tissues Diffuse subcutaneous edema especially dorsally along the foot. Bilobulated 2.4 by 0.7 by 1.0 cm lesion in the subcutaneous tissues of the dorsum of the foot (image 25, series 7 with accentuated T1 signal, possibly representing thrombosed venous structure or proteinaceous fluid in a cystic lesion. It  would be unusual for an abscess to have high T1 signal although given the surrounding edema and potential cellulitis, a small abscess is difficult to totally rule out. IMPRESSION: 1. Active osteomyelitis of the proximal and distal phalanges of the great toe with suspected septic interphalangeal joint. 2. Bilobulated 2.4 by 0.7 by 1.0 cm lesion in the subcutaneous tissues of the dorsum of the foot with accentuated T1 signal, possibly representing thrombosed venous structure or proteinaceous fluid in a cystic lesion. It would be unusual for an abscess to have high T1 signal although given the surrounding edema and potential cellulitis, a small abscess is difficult to totally rule out. 3. Diffuse subcutaneous edema especially dorsally along the foot. 4. Low-grade diffuse muscular edema is likely neurogenic given the diffuse nature. Electronically Signed   By: Van Clines M.D.   On: 04/13/2022 14:54   DG Foot Complete Left  Result Date:  04/13/2022 CLINICAL DATA:  Infection. Cellulitis. Redness and swelling of left foot. EXAM: LEFT FOOT - COMPLETE 3+ VIEW COMPARISON:  Left foot radiographs 05/14/2016 FINDINGS: There is moderate to high-grade great toe soft tissue swelling. There is new moderate to high-grade fragmentation/erosion of the distal phalanx of the great toe. There is also high-grade erosion of the distal most aspect of the adjacent proximal phalanx of the great toe. There is mild diastasis of the cortical fragments. Minimal medial great toe metatarsophalangeal joint space narrowing. There is a prominent anterior process of the calcaneus that appears to articulate with the lateral aspect of the navicular, with mild-to-moderate joint space narrowing, subchondral sclerosis, and peripheral osteophytosis that is greatest at the plantar/lateral aspect of the joint, best seen on oblique view. Minimal dorsal talonavicular and navicular-cuneiform degenerative osteophytes are unchanged from prior. IMPRESSION: 1. New moderate to high-grade fragmentation/erosion of the distal phalanx of the great toe and the distal most aspect of the proximal phalanx of the great toe. Findings are highly suspicious for acute osteomyelitis. 2. Moderate to high-grade great toe soft tissue swelling. 3. 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 Electronically Signed   By: Yvonne Kendall M.D.   On: 04/13/2022 12:17        Scheduled Meds:  insulin aspart  0-6 Units Subcutaneous TID WC   nicotine  21 mg Transdermal Daily   sodium chloride flush  3 mL Intravenous Q12H   sodium chloride flush  3 mL Intravenous Q12H   Continuous Infusions:  sodium chloride 100 mL/hr at 04/13/22 1624   sodium chloride      ceFAZolin (ANCEF) IV 2 g (04/14/22 1026)   vancomycin 1,750 mg (04/14/22 0315)     LOS: 1 day    Time spent: 35 minutes    Dawna Jakes D Manuella Ghazi, DO [facility]  If 7PM-7AM, please contact night-coverage [email or web address] 04/14/2022, 11:41 AM

## 2022-04-14 NOTE — Progress Notes (Signed)
PT Cancellation Note  Patient Details Name: Jon Chandler MRN: [id number] DOB: [birth year]   Cancelled Treatment:    Reason Eval/Treat Not Completed: Medical issues which prohibited therapy.  Patient positive for bilateral DVT's.  Will check back tomorrow.  4:27 PM, 04/14/22 Lonell Grandchild, MPT Physical Therapist with [facility] [phone] office [phone] mobile phone

## 2022-04-14 NOTE — Consult Note (Addendum)
[facility] Consult  Reason for Consult: Osteomyelitis of left great toe Referring Physician: Dr. Flossie Dibble  Chief Complaint   Cellulitis     HPI: Jon Chandler is a 52 y.o. male who was admitted with cellulitis of the left lower extremity.  He had a previous ulcer on his left toe that first appeared last summer, and it was slowly starting to improve, but about a month ago, he started to have increasing redness in his left foot.  The erythema and swelling worsened over the last week, which prompted him to come to the emergency department.  He does have a history of diabetic neuropathy, and is unable to feel anything in his left foot.  His past medical history is significant for diabetes.  He denies ever having any vascular procedures.  His surgical history significant for a gastric bypass.  He denies use of blood thinning medications.  He denies fever and chills since being admitted in the hospital.  He feels that the erythema has slightly improved since being in the hospital.  Past Medical History:  Diagnosis Date   Diabetes mellitus without complication (HCC)     Past Surgical History:  Procedure Laterality Date   GASTRIC BYPASS      History reviewed. No pertinent family history.  Social History   Tobacco Use   Smoking status: Every Day    Packs/day: 1.00    Types: Cigarettes   Smokeless tobacco: Never  Vaping Use   Vaping Use: Never used  Substance Use Topics   Alcohol use: Yes    Comment: drinks every few days   Drug use: Not Currently    Medications: I have reviewed the patient's current medications.  Allergies  Allergen Reactions   Penicillins     Other Reaction(s): Other - See Comments  As infant unsure     ROS:  Pertinent items are noted in HPI.  Blood pressure 117/89, pulse 80, temperature 98.2 F (36.8 C), temperature source Oral, resp. rate 18, height 6\' 3"  (1.905 m), weight 112.6 kg, SpO2 100 %. Physical Exam Vitals reviewed.   Constitutional:      Appearance: Normal appearance.  HENT:     Head: Normocephalic and atraumatic.  Eyes:     Extraocular Movements: Extraocular movements intact.     Pupils: Pupils are equal, round, and reactive to light.  Cardiovascular:     Rate and Rhythm: Normal rate.  Pulmonary:     Effort: Pulmonary effort is normal.  Abdominal:     General: There is no distension.     Palpations: Abdomen is soft.     Tenderness: There is no abdominal tenderness.  Musculoskeletal:     Cervical back: Normal range of motion.     Comments: 2+ DP and PT in left foot  Skin:    Comments: Left lower extremity with erythema of the left calf, mild erythema of left lateral dorsum of foot, previously healed left great toe ulcer, scabbed area at the left lateral aspect of great toe, no significant erythema or tenderness of left toe; pitting edema bilateral lower extremities  Neurological:     Mental Status: He is alert and oriented to person, place, and time.  Psychiatric:        Mood and Affect: Mood normal.        Behavior: Behavior normal.     Results: Results for orders placed or performed during the hospital encounter of 04/13/22 (from the past 48 hour(s))  CBC with Differential  Status: Abnormal   Collection Time: 04/13/22 12:20 PM  Result Value Ref Range   WBC 11.3 (H) 4.0 - 10.5 K/uL   RBC 5.51 4.22 - 5.81 MIL/uL   Hemoglobin 13.4 13.0 - 17.0 g/dL   HCT 43.7 39.0 - 52.0 %   MCV 79.3 (L) 80.0 - 100.0 fL   MCH 24.3 (L) 26.0 - 34.0 pg   MCHC 30.7 30.0 - 36.0 g/dL   RDW 20.0 (H) 11.5 - 15.5 %   Platelets 613 (H) 150 - 400 K/uL   nRBC 0.0 0.0 - 0.2 %   Neutrophils Relative % 75 %   Neutro Abs 8.5 (H) 1.7 - 7.7 K/uL   Lymphocytes Relative 13 %   Lymphs Abs 1.4 0.7 - 4.0 K/uL   Monocytes Relative 9 %   Monocytes Absolute 1.0 0.1 - 1.0 K/uL   Eosinophils Relative 3 %   Eosinophils Absolute 0.3 0.0 - 0.5 K/uL   Basophils Relative 0 %   Basophils Absolute 0.0 0.0 - 0.1 K/uL    Immature Granulocytes 0 %   Abs Immature Granulocytes 0.04 0.00 - 0.07 K/uL    Comment: Performed at [facility], [address], [place], [place] [address]  Comprehensive metabolic panel     Status: Abnormal   Collection Time: 04/13/22 12:20 PM  Result Value Ref Range   Sodium 135 135 - 145 mmol/L   Potassium 3.6 3.5 - 5.1 mmol/L   Chloride 97 (L) 98 - 111 mmol/L   CO2 23 22 - 32 mmol/L   Glucose, Bld 94 70 - 99 mg/dL    Comment: Glucose reference range applies only to samples taken after fasting for at least 8 hours.   BUN 7 6 - 20 mg/dL   Creatinine, Ser 0.66 0.61 - 1.24 mg/dL   Calcium 10.0 8.9 - 10.3 mg/dL   Total Protein 9.9 (H) 6.5 - 8.1 g/dL   Albumin 3.6 3.5 - 5.0 g/dL   AST 27 15 - 41 U/L   ALT 25 0 - 44 U/L   Alkaline Phosphatase 114 38 - 126 U/L   Total Bilirubin 0.5 0.3 - 1.2 mg/dL   GFR, Estimated >60 >60 mL/min    Comment: (NOTE) Calculated using the CKD-EPI Creatinine Equation (2021)    Anion gap 15 5 - 15    Comment: Performed at [facility], [address] [place], [place] [address]  Lactic acid, plasma     Status: None   Collection Time: 04/13/22 12:20 PM  Result Value Ref Range   Lactic Acid, Venous 1.8 0.5 - 1.9 mmol/L    Comment: Performed at [facility], [address] [place], [place] [address]  HIV Antibody (routine testing w rflx)     Status: None   Collection Time: 04/13/22 12:20 PM  Result Value Ref Range   HIV Screen 4th Generation wRfx Non Reactive Non Reactive    Comment: Performed at [facility], [place] [address], [place], [place] [address]  Magnesium     Status: None   Collection Time: 04/13/22 12:20 PM  Result Value Ref Range   Magnesium 2.2 1.7 - 2.4 mg/dL    Comment: Performed at [facility], [address], [place], [place] [address]  Phosphorus     Status: None   Collection Time: 04/13/22 12:20 PM  Result Value Ref Range   Phosphorus 3.8 2.5 - 4.6 mg/dL    Comment: Performed at [facility], [address], [place],   [address]  Protime-INR  Status: None   Collection Time: 04/13/22 12:20 PM  Result Value Ref Range   Prothrombin Time 12.6 11.4 - 15.2 seconds   INR 1.0 0.8 - 1.2    Comment: (NOTE) INR goal varies based on device and disease states. Performed at [facility], [address], [place], [place] [address]   Hemoglobin A1c     Status: Abnormal   Collection Time: 04/13/22 12:20 PM  Result Value Ref Range   Hgb A1c MFr Bld 5.9 (H) 4.8 - 5.6 %    Comment: (NOTE) Pre diabetes:          5.7%-6.4%  Diabetes:              >6.4%  Glycemic control for   <7.0% adults with diabetes    Mean Plasma Glucose 122.63 mg/dL    Comment: Performed at [facility], [address], [place], [place] [address]  Lactic acid, plasma     Status: None   Collection Time: 04/13/22  1:51 PM  Result Value Ref Range   Lactic Acid, Venous 0.9 0.5 - 1.9 mmol/L    Comment: Performed at [facility], [address], [place], [place] [address]  Glucose, capillary     Status: Abnormal   Collection Time: 04/13/22  5:04 PM  Result Value Ref Range   Glucose-Capillary 123 (H) 70 - 99 mg/dL    Comment: Glucose reference range applies only to samples taken after fasting for at least 8 hours.   Comment 1 Notify RN    Comment 2 Document in Chart   Glucose, capillary     Status: Abnormal   Collection Time: 04/13/22  9:53 PM  Result Value Ref Range   Glucose-Capillary 160 (H) 70 - 99 mg/dL    Comment: Glucose reference range applies only to samples taken after fasting for at least 8 hours.   Comment 1 Notify RN    Comment 2 Document in Chart   Basic metabolic panel     Status: Abnormal   Collection Time: 04/14/22  4:36 AM  Result Value Ref Range   Sodium 132 (L) 135 - 145 mmol/L   Potassium 3.4 (L) 3.5 - 5.1 mmol/L   Chloride 96 (L) 98 - 111 mmol/L   CO2 25 22 - 32 mmol/L   Glucose, Bld 90 70 - 99 mg/dL    Comment: Glucose reference range applies only to samples taken after fasting for at least 8 hours.   BUN 7 6 -  20 mg/dL   Creatinine, Ser 9.16 (L) 0.61 - 1.24 mg/dL   Calcium 8.7 (L) 8.9 - 10.3 mg/dL   GFR, Estimated >38 >46 mL/min    Comment: (NOTE) Calculated using the CKD-EPI Creatinine Equation (2021)    Anion gap 11 5 - 15    Comment: Performed at [facility], [address], [place], [place] [address]  CBC     Status: Abnormal   Collection Time: 04/14/22  4:36 AM  Result Value Ref Range   WBC 9.0 4.0 - 10.5 K/uL   RBC 4.10 (L) 4.22 - 5.81 MIL/uL   Hemoglobin 10.1 (L) 13.0 - 17.0 g/dL   HCT 57.0 (L) 17.7 - 93.9 %   MCV 80.0 80.0 - 100.0 fL   MCH 24.6 (L) 26.0 - 34.0 pg   MCHC 30.8 30.0 - 36.0 g/dL   RDW 03.0 (H) 09.2 - 33.0 %   Platelets 498 (H) 150 - 400 K/uL   nRBC 0.0 0.0 - 0.2 %  Comment: Performed at [facility], [address] [place], [place] [address]  Protime-INR     Status: None   Collection Time: 04/14/22  4:36 AM  Result Value Ref Range   Prothrombin Time 13.1 11.4 - 15.2 seconds   INR 1.0 0.8 - 1.2    Comment: (NOTE) INR goal varies based on device and disease states. Performed at [facility], [address] [place], [place] [address]   APTT     Status: None   Collection Time: 04/14/22  4:36 AM  Result Value Ref Range   aPTT 33 24 - 36 seconds    Comment: Performed at [facility], [address] [place],  [address]  Glucose, capillary     Status: None   Collection Time: 04/14/22  8:04 AM  Result Value Ref Range   Glucose-Capillary 78 70 - 99 mg/dL    Comment: Glucose reference range applies only to samples taken after fasting for at least 8 hours.   Comment 1 Notify RN    Comment 2 Document in Chart     US Venous Img Lower Bilateral (DVT)  Result Date: 04/14/2022 CLINICAL DATA:  9965 Edema 9965 [id number] Pain [id number] EXAM: BILATERAL LOWER EXTREMITY VENOUS DOPPLER ULTRASOUND TECHNIQUE: Gray-scale sonography with graded compression, as well as color Doppler and duplex ultrasound were performed to evaluate the lower extremity deep venous systems from the  level of the common femoral vein and including the common femoral, femoral, profunda femoral, popliteal and calf veins including the posterior tibial, peroneal and gastrocnemius veins when visible. The superficial great saphenous vein was also interrogated. Spectral Doppler was utilized to evaluate flow at rest and with distal augmentation maneuvers in the common femoral, femoral and popliteal veins. COMPARISON:  LEFT foot XRs, concurrent. FINDINGS: RIGHT LOWER EXTREMITY VENOUS Heterogeneously hypoechoic filling defect within the imaged portions of peripheral femoral vein and the paired peroneal veins, with incomplete compressibility. Normal compressibility of the RIGHT common femoral, popliteal, as well as the visualized portions of profunda femoral and great saphenous veins. OTHER No evidence of superficial thrombophlebitis or abnormal fluid collection. Limitations: none LEFT LOWER EXTREMITY VENOUS Heterogeneously hypoechoic filling defect within the imaged portions of 1 of the paired posterior tibial and peroneal veins, with incomplete compressibility. Normal compressibility of the LEFT common femoral, superficial femoral, and popliteal veins. Visualized portions of profunda femoral vein and great saphenous vein unremarkable. OTHER No evidence of superficial thrombophlebitis or abnormal fluid collection. Limitations: none Pathologically-enlarged LEFT inguinal lymph node. IMPRESSION: 1. Examination is POSITIVE for bilateral lower extremity DVT within the RIGHT femoral vein and LEFT calf veins, as above. 2. No evidence of superficial thrombophlebitis within either lower extremity. 3. Pathologically-enlarged LEFT inguinal lymph node, likely reactive. Michaelle Birks, MD [facility] Electronically Signed   By: Michaelle Birks M.D.   On: 04/14/2022 10:34   MR FOOT LEFT WO CONTRAST  Result Date: 04/13/2022 CLINICAL DATA:  Foot infection, query osteomyelitis EXAM:  MRI OF THE LEFT FOOT WITHOUT CONTRAST TECHNIQUE: Multiplanar, multisequence MR imaging of the left foot was performed. No intravenous contrast was administered. COMPARISON:  Radiographs 04/13/2022 FINDINGS: Bones/Joint/Cartilage Abnormal edema and bony destructive findings in the proximal and distal phalanges of the great toe compatible with active osteomyelitis. Probable septic interphalangeal joint with complex joint effusion. Faint and patchy edema signal in the lateral cuneiform and cuboid, likely degenerative. Subtle and likely degenerative marrow edema proximally in the second metatarsal. Ligaments Lisfranc ligament intact Muscles and Tendons Mild regional muscular  atrophy. Low-grade diffuse muscular edema is likely neurogenic given the diffuse nature. Soft tissues Diffuse subcutaneous edema especially dorsally along the foot. Bilobulated 2.4 by 0.7 by 1.0 cm lesion in the subcutaneous tissues of the dorsum of the foot (image 25, series 7 with accentuated T1 signal, possibly representing thrombosed venous structure or proteinaceous fluid in a cystic lesion. It would be unusual for an abscess to have high T1 signal although given the surrounding edema and potential cellulitis, a small abscess is difficult to totally rule out. IMPRESSION: 1. Active osteomyelitis of the proximal and distal phalanges of the great toe with suspected septic interphalangeal joint. 2. Bilobulated 2.4 by 0.7 by 1.0 cm lesion in the subcutaneous tissues of the dorsum of the foot with accentuated T1 signal, possibly representing thrombosed venous structure or proteinaceous fluid in a cystic lesion. It would be unusual for an abscess to have high T1 signal although given the surrounding edema and potential cellulitis, a small abscess is difficult to totally rule out. 3. Diffuse subcutaneous edema especially dorsally along the foot. 4. Low-grade diffuse muscular edema is likely neurogenic given the diffuse nature. Electronically Signed    By: Gaylyn Rong M.D.   On: 04/13/2022 14:54   DG Foot Complete Left  Result Date: 04/13/2022 CLINICAL DATA:  Infection. Cellulitis. Redness and swelling of left foot. EXAM: LEFT FOOT - COMPLETE 3+ VIEW COMPARISON:  Left foot radiographs 05/14/2016 FINDINGS: There is moderate to high-grade great toe soft tissue swelling. There is new moderate to high-grade fragmentation/erosion of the distal phalanx of the great toe. There is also high-grade erosion of the distal most aspect of the adjacent proximal phalanx of the great toe. There is mild diastasis of the cortical fragments. Minimal medial great toe metatarsophalangeal joint space narrowing. There is a prominent anterior process of the calcaneus that appears to articulate with the lateral aspect of the navicular, with mild-to-moderate joint space narrowing, subchondral sclerosis, and peripheral osteophytosis that is greatest at the plantar/lateral aspect of the joint, best seen on oblique view. Minimal dorsal talonavicular and navicular-cuneiform degenerative osteophytes are unchanged from prior. IMPRESSION: 1. New moderate to high-grade fragmentation/erosion of the distal phalanx of the great toe and the distal most aspect of the proximal phalanx of the great toe. Findings are highly suspicious for acute osteomyelitis. 2. Moderate to high-grade great toe soft tissue swelling. 3. 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 Electronically Signed   By: Neita Garnet M.D.   On: 04/13/2022 12:17     Assessment & Plan:  Jon Chandler is a 53 y.o. male who was admitted with osteomyelitis of the left great toe and cellulitis of the left lower extremity.  Imaging and blood work evaluated by myself  -I discussed with the patient that he likely developed an infection below the skin secondary to the wounds in his left great toe.  The infection has gone down to and caused  erosive changes on his bone.  The most definitive treatment for osteomyelitis would be to amputate the involved bone, which would require an amputation of his left great toe. -The risk and benefits of left great toe amputation were discussed including but not limited to bleeding, infection, injury to surrounding structures poor wound healing, and need for additional procedures.  After careful consideration, Jon Chandler has decided to proceed with surgery.  -Will plan for surgery tomorrow, 2/8 -NPO at midnight -IV antibiotics per ID -Leukocytosis has resolved this morning, 9 from  11.3 -PRN pain control and antiemetics -Will plan for patient to be evaluated by physical therapy after surgery -Appreciate hospitalist recommendations  All questions were answered to the satisfaction of the patient and family.  -- Graciella Freer, DO [facility] [address] [place], [place] [address] [phone] (office)

## 2022-04-14 NOTE — Progress Notes (Signed)
ANTICOAGULATION CONSULT NOTE - Initial Consult  Pharmacy Consult for lovenox Indication: DVT  Allergies  Allergen Reactions   Penicillins     Other Reaction(s): Other - See Comments  As infant unsure    Patient Measurements: Height: 6\' 3"  (190.5 cm) Weight: 112.6 kg (248 lb 3.8 oz) IBW/kg (Calculated) : 84.5  Vital Signs: Temp: 98.2 F (36.8 C) (02/07 0800) Temp Source: Oral (02/07 0800) BP: 117/89 (02/07 0800) Pulse Rate: 80 (02/07 0800)  Labs: Recent Labs    04/13/22 1220 04/14/22 0436  HGB 13.4 10.1*  HCT 43.7 32.8*  PLT 613* 498*  APTT  --  33  LABPROT 12.6 13.1  INR 1.0 1.0  CREATININE 0.66 0.59*    Estimated Creatinine Clearance: 146.2 mL/min (A) (by C-G formula based on SCr of 0.59 mg/dL (L)).   Medical History: Past Medical History:  Diagnosis Date   Diabetes mellitus without complication [facility])     Assessment: 52 year old male presenting to [facility] with cellulitis after failing outpatient therapy. 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 New orders to change sq heparin to lovenox. Hemoglobin is down from admit, this can be dilutional. Will follow closely.   Will check copays for DOACs.   Goal of Therapy:  Anti-Xa level 0.6-1 units/ml 4hrs after LMWH dose given Monitor platelets by anticoagulation protocol: Yes   Plan:  Lovenox 120mg  sq q12 hours CBC ordered daily by MD  Erin Hearing PharmD., BCPS Clinical Pharmacist 04/14/2022 12:28 PM

## 2022-04-14 NOTE — Progress Notes (Signed)
[facility] for Infectious Disease    Date of Admission:  04/13/2022   Total days of antibiotics 2/vanco and cefazolin   ID: Jon Chandler is a 52 y.o. male with left foot cellulitis and acute OM of left great toe. Evaluated by surgery who will do great toe amputation as definitive treatment Principal Problem:   Osteomyelitis ([place]) Active Problems:   Lower extremity cellulitis   DMII (diabetes mellitus, type 2) (HCC)    Subjective: Afebrile.   Medications:   [START ON 04/15/2022] enoxaparin (LOVENOX) injection  120 mg Subcutaneous Q12H   insulin aspart  0-6 Units Subcutaneous TID WC   nicotine  21 mg Transdermal Daily   sodium chloride flush  3 mL Intravenous Q12H   sodium chloride flush  3 mL Intravenous Q12H    Objective: Vital signs in last 24 hours: Temp:  [97.6 F (36.4 C)-98.5 F (36.9 C)] 97.6 F (36.4 C) (02/07 1427) Pulse Rate:  [78-93] 78 (02/07 1427) Resp:  [18-20] 20 (02/07 1427) BP: (117-129)/(66-89) 119/66 (02/07 1427) SpO2:  [97 %-100 %] 97 % (02/07 1427) Weight:  [112.6 kg] 112.6 kg (02/07 0500)  Did not see  Lab Results Recent Labs    04/13/22 1220 04/14/22 0436  WBC 11.3* 9.0  HGB 13.4 10.1*  HCT 43.7 32.8*  NA 135 132*  K 3.6 3.4*  CL 97* 96*  CO2 23 25  BUN 7 7  CREATININE 0.66 0.59*   Liver Panel Recent Labs    04/13/22 1220  PROT 9.9*  ALBUMIN 3.6  AST 27  ALT 25  ALKPHOS 114  BILITOT 0.5    Microbiology: Blood cx NGTD MRSA negative Studies/Results: US ARTERIAL ABI (SCREENING LOWER EXTREMITY)  Result Date: 04/14/2022 CLINICAL DATA:  Left first toe osteomyelitis and dorsal foot wound. History of diabetes. EXAM: NONINVASIVE PHYSIOLOGIC VASCULAR STUDY OF BILATERAL LOWER EXTREMITIES TECHNIQUE: Evaluation of both lower extremities were performed at rest, including calculation of ankle-brachial indices with single level Doppler, pressure and pulse volume recording. COMPARISON:  None Available. FINDINGS: Right ABI:  1.25  Left ABI:  1.19 Right Lower Extremity: Biphasic posterior tibial and monophasic dorsalis pedis waveforms. Left Lower Extremity: Biphasic posterior tibial and dorsalis pedis waveforms. 1.0-1.4 Normal IMPRESSION: Normal resting ankle-brachial indices with some distal waveform abnormalities. Findings suggest no significant arterial occlusive disease in either lower extremity but some component of probable tibial disease bilaterally. Electronically Signed   By: Aletta Edouard M.D.   On: 04/14/2022 12:57   US Venous Img Lower Bilateral (DVT)  Result Date: 04/14/2022 CLINICAL DATA:  9965 Edema 9965 [id number] Pain 144615 EXAM: BILATERAL LOWER EXTREMITY VENOUS DOPPLER ULTRASOUND TECHNIQUE: Gray-scale sonography with graded compression, as well as color Doppler and duplex ultrasound were performed to evaluate the lower extremity deep venous systems from the level of the common femoral vein and including the common femoral, femoral, profunda femoral, popliteal and calf veins including the posterior tibial, peroneal and gastrocnemius veins when visible. The superficial great saphenous vein was also interrogated. Spectral Doppler was utilized to evaluate flow at rest and with distal augmentation maneuvers in the common femoral, femoral and popliteal veins. COMPARISON:  LEFT foot XRs, concurrent. FINDINGS: RIGHT LOWER EXTREMITY VENOUS Heterogeneously hypoechoic filling defect within the imaged portions of peripheral femoral vein and the paired peroneal veins, with incomplete compressibility. Normal compressibility of the RIGHT common femoral, popliteal, as well as the visualized portions of profunda femoral and great saphenous veins. OTHER No evidence of superficial thrombophlebitis or abnormal fluid collection.  Limitations: none LEFT LOWER EXTREMITY VENOUS Heterogeneously hypoechoic filling defect within the imaged portions of 1 of the paired posterior tibial and peroneal veins, with incomplete compressibility. Normal  compressibility of the LEFT common femoral, superficial femoral, and popliteal veins. Visualized portions of profunda femoral vein and great saphenous vein unremarkable. OTHER No evidence of superficial thrombophlebitis or abnormal fluid collection. Limitations: none Pathologically-enlarged LEFT inguinal lymph node. IMPRESSION: 1. Examination is POSITIVE for bilateral lower extremity DVT within the RIGHT femoral vein and LEFT calf veins, as above. 2. No evidence of superficial thrombophlebitis within either lower extremity. 3. Pathologically-enlarged LEFT inguinal lymph node, likely reactive. Michaelle Birks, MD [facility] Radiology Electronically Signed   By: Michaelle Birks M.D.   On: 04/14/2022 10:34   MR FOOT LEFT WO CONTRAST  Result Date: 04/13/2022 CLINICAL DATA:  Foot infection, query osteomyelitis EXAM: MRI OF THE LEFT FOOT WITHOUT CONTRAST TECHNIQUE: Multiplanar, multisequence MR imaging of the left foot was performed. No intravenous contrast was administered. COMPARISON:  Radiographs 04/13/2022 FINDINGS: Bones/Joint/Cartilage Abnormal edema and bony destructive findings in the proximal and distal phalanges of the great toe compatible with active osteomyelitis. Probable septic interphalangeal joint with complex joint effusion. Faint and patchy edema signal in the lateral cuneiform and cuboid, likely degenerative. Subtle and likely degenerative marrow edema proximally in the second metatarsal. Ligaments Lisfranc ligament intact Muscles and Tendons Mild regional muscular atrophy. Low-grade diffuse muscular edema is likely neurogenic given the diffuse nature. Soft tissues Diffuse subcutaneous edema especially dorsally along the foot. Bilobulated 2.4 by 0.7 by 1.0 cm lesion in the subcutaneous tissues of the dorsum of the foot (image 25, series 7 with accentuated T1 signal, possibly representing thrombosed venous structure or proteinaceous fluid in a cystic lesion.  It would be unusual for an abscess to have high T1 signal although given the surrounding edema and potential cellulitis, a small abscess is difficult to totally rule out. IMPRESSION: 1. Active osteomyelitis of the proximal and distal phalanges of the great toe with suspected septic interphalangeal joint. 2. Bilobulated 2.4 by 0.7 by 1.0 cm lesion in the subcutaneous tissues of the dorsum of the foot with accentuated T1 signal, possibly representing thrombosed venous structure or proteinaceous fluid in a cystic lesion. It would be unusual for an abscess to have high T1 signal although given the surrounding edema and potential cellulitis, a small abscess is difficult to totally rule out. 3. Diffuse subcutaneous edema especially dorsally along the foot. 4. Low-grade diffuse muscular edema is likely neurogenic given the diffuse nature. Electronically Signed   By: Van Clines M.D.   On: 04/13/2022 14:54   DG Foot Complete Left  Result Date: 04/13/2022 CLINICAL DATA:  Infection. Cellulitis. Redness and swelling of left foot. EXAM: LEFT FOOT - COMPLETE 3+ VIEW COMPARISON:  Left foot radiographs 05/14/2016 FINDINGS: There is moderate to high-grade great toe soft tissue swelling. There is new moderate to high-grade fragmentation/erosion of the distal phalanx of the great toe. There is also high-grade erosion of the distal most aspect of the adjacent proximal phalanx of the great toe. There is mild diastasis of the cortical fragments. Minimal medial great toe metatarsophalangeal joint space narrowing. There is a prominent anterior process of the calcaneus that appears to articulate with the lateral aspect of the navicular, with mild-to-moderate joint space narrowing, subchondral sclerosis, and peripheral osteophytosis that is greatest at the plantar/lateral aspect of the joint, best seen on oblique view. Minimal dorsal talonavicular and navicular-cuneiform degenerative osteophytes are unchanged from prior.  IMPRESSION: 1. New moderate to high-grade fragmentation/erosion of the distal phalanx of the great toe and the distal most aspect of the proximal phalanx of the great toe. Findings are highly suspicious for acute osteomyelitis. 2. Moderate to high-grade great toe soft tissue swelling. 3. 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 Electronically Signed   By: Yvonne Kendall M.D.   On: 04/13/2022 12:17     Assessment/Plan: Great toe osteomyelitis and cellulitis = agree with plan for amputation for source control. Please send tissue from proximal edge for aerobic/anaerobic culture.  Continue on current abtx for the time being.  Will check sed rate and crp  [facility] for Infectious Diseases Pager: [phone]  04/14/2022, 5:04 PM

## 2022-04-15 ENCOUNTER — Inpatient Hospital Stay (HOSPITAL_COMMUNITY): Payer: 59 | Admitting: Anesthesiology

## 2022-04-15 ENCOUNTER — Other Ambulatory Visit: Payer: Self-pay

## 2022-04-15 ENCOUNTER — Encounter (HOSPITAL_COMMUNITY): Payer: Self-pay | Admitting: Family Medicine

## 2022-04-15 ENCOUNTER — Encounter (HOSPITAL_COMMUNITY)
Admission: EM | Disposition: A | Discharge status: Home / Self Care | Payer: Self-pay | Source: Home / Self Care | Attending: Internal Medicine

## 2022-04-15 DIAGNOSIS — E1169 Type 2 diabetes mellitus with other specified complication: Secondary | ICD-10-CM

## 2022-04-15 DIAGNOSIS — E1151 Type 2 diabetes mellitus with diabetic peripheral angiopathy without gangrene: Secondary | ICD-10-CM

## 2022-04-15 DIAGNOSIS — M869 Osteomyelitis, unspecified: Secondary | ICD-10-CM

## 2022-04-15 DIAGNOSIS — M86072 Acute hematogenous osteomyelitis, left ankle and foot: Secondary | ICD-10-CM | POA: Diagnosis not present

## 2022-04-15 HISTORY — PX: AMPUTATION TOE: SHX6595

## 2022-04-15 LAB — CBC
HCT: 32 % — ABNORMAL LOW (ref 39.0–52.0)
Hemoglobin: 9.8 g/dL — ABNORMAL LOW (ref 13.0–17.0)
MCH: 24.4 pg — ABNORMAL LOW (ref 26.0–34.0)
MCHC: 30.6 g/dL (ref 30.0–36.0)
MCV: 79.6 fL — ABNORMAL LOW (ref 80.0–100.0)
Platelets: 531 10*3/uL — ABNORMAL HIGH (ref 150–400)
RBC: 4.02 MIL/uL — ABNORMAL LOW (ref 4.22–5.81)
RDW: 19.6 % — ABNORMAL HIGH (ref 11.5–15.5)
WBC: 8 10*3/uL (ref 4.0–10.5)
nRBC: 0 % (ref 0.0–0.2)

## 2022-04-15 LAB — BASIC METABOLIC PANEL
Anion gap: 7 (ref 5–15)
BUN: 5 mg/dL — ABNORMAL LOW (ref 6–20)
CO2: 27 mmol/L (ref 22–32)
Calcium: 8.5 mg/dL — ABNORMAL LOW (ref 8.9–10.3)
Chloride: 100 mmol/L (ref 98–111)
Creatinine, Ser: 0.59 mg/dL — ABNORMAL LOW (ref 0.61–1.24)
GFR, Estimated: >60 mL/min (ref >60)
Glucose, Bld: 94 mg/dL (ref 70–99)
Potassium: 3.9 mmol/L (ref 3.5–5.1)
Sodium: 134 mmol/L — ABNORMAL LOW (ref 135–145)

## 2022-04-15 LAB — MAGNESIUM: Magnesium: 2 mg/dL (ref 1.7–2.4)

## 2022-04-15 LAB — GLUCOSE, CAPILLARY
Glucose-Capillary: 77 mg/dL (ref 70–99)
Glucose-Capillary: 119 mg/dL — ABNORMAL HIGH (ref 70–99)
Glucose-Capillary: 86 mg/dL (ref 70–99)
Glucose-Capillary: 191 mg/dL — ABNORMAL HIGH (ref 70–99)
Glucose-Capillary: 246 mg/dL — ABNORMAL HIGH (ref 70–99)

## 2022-04-15 SURGERY — AMPUTATION, TOE
Anesthesia: General | Site: Toe | Laterality: Left

## 2022-04-15 MED ORDER — PROPOFOL 10 MG/ML IV BOLUS
INTRAVENOUS | Status: AC
  Filled 2022-04-15: qty 20

## 2022-04-15 MED ORDER — CHLORHEXIDINE GLUCONATE 0.12 % MT SOLN
OROMUCOSAL | Status: AC
  Filled 2022-04-15: qty 15

## 2022-04-15 MED ORDER — LACTATED RINGERS IV SOLN: INTRAVENOUS | Status: DC

## 2022-04-15 MED ORDER — FENTANYL CITRATE (PF) 100 MCG/2ML IJ SOLN
INTRAMUSCULAR | Status: DC | PRN
  Administered 2022-04-15 (×2): 50 ug via INTRAVENOUS

## 2022-04-15 MED ORDER — LACTATED RINGERS IV SOLN: INTRAVENOUS | Status: DC | PRN

## 2022-04-15 MED ORDER — LIDOCAINE HCL (CARDIAC) PF 100 MG/5ML IV SOSY
PREFILLED_SYRINGE | INTRAVENOUS | Status: DC | PRN
  Administered 2022-04-15: 100 mg via INTRAVENOUS

## 2022-04-15 MED ORDER — MIDAZOLAM HCL 5 MG/5ML IJ SOLN
INTRAMUSCULAR | Status: DC | PRN
  Administered 2022-04-15: 2 mg via INTRAVENOUS

## 2022-04-15 MED ORDER — BUPIVACAINE HCL (PF) 0.5 % IJ SOLN
INTRAMUSCULAR | Status: DC | PRN
  Administered 2022-04-15: 30 mL

## 2022-04-15 MED ORDER — ORAL CARE MOUTH RINSE: 15.0000 mL | Freq: Once | OROMUCOSAL | Status: DC

## 2022-04-15 MED ORDER — 0.9 % SODIUM CHLORIDE (POUR BTL) OPTIME
TOPICAL | Status: DC | PRN
  Administered 2022-04-15: 1000 mL

## 2022-04-15 MED ORDER — CHLORHEXIDINE GLUCONATE 0.12 % MT SOLN: 15.0000 mL | Freq: Once | OROMUCOSAL | Status: DC

## 2022-04-15 MED ORDER — MIDAZOLAM HCL 2 MG/2ML IJ SOLN
INTRAMUSCULAR | Status: AC
  Filled 2022-04-15: qty 2

## 2022-04-15 MED ORDER — PROPOFOL 10 MG/ML IV BOLUS
INTRAVENOUS | Status: DC | PRN
  Administered 2022-04-15: 200 mg via INTRAVENOUS

## 2022-04-15 MED ORDER — BUPIVACAINE HCL (PF) 0.5 % IJ SOLN
INTRAMUSCULAR | Status: AC
  Filled 2022-04-15: qty 30

## 2022-04-15 MED ORDER — FENTANYL CITRATE (PF) 100 MCG/2ML IJ SOLN
INTRAMUSCULAR | Status: AC
  Filled 2022-04-15: qty 2

## 2022-04-15 SURGICAL SUPPLY — 29 items
BNDG CMPR STD VLCR NS LF 5.8X3 (GAUZE/BANDAGES/DRESSINGS) ×1
BNDG CMPR STD VLCR NS LF 5.8X4 (GAUZE/BANDAGES/DRESSINGS) ×1
BNDG ELASTIC 3X5.8 VLCR NS LF (GAUZE/BANDAGES/DRESSINGS) IMPLANT
BNDG ELASTIC 4X5.8 VLCR NS LF (GAUZE/BANDAGES/DRESSINGS) ×1 IMPLANT
BNDG GAUZE DERMACEA FLUFF 4 (GAUZE/BANDAGES/DRESSINGS) IMPLANT
BNDG GZE DERMACEA 4 6PLY (GAUZE/BANDAGES/DRESSINGS) ×1
CLOTH BEACON ORANGE TIMEOUT ST (SAFETY) ×1 IMPLANT
COVER LIGHT HANDLE STERIS (MISCELLANEOUS) ×2 IMPLANT
ELECT REM PT RETURN 9FT ADLT (ELECTROSURGICAL) ×1
ELECTRODE REM PT RTRN 9FT ADLT (ELECTROSURGICAL) ×1 IMPLANT
GAUZE SPONGE 4X4 12PLY STRL (GAUZE/BANDAGES/DRESSINGS) ×2 IMPLANT
GAUZE XEROFORM 1X8 LF (GAUZE/BANDAGES/DRESSINGS) ×1 IMPLANT
GLOVE BIOGEL PI IND STRL 6.5 (GLOVE) ×1 IMPLANT
GLOVE BIOGEL PI IND STRL 7.0 (GLOVE) ×2 IMPLANT
GLOVE SURG SS PI 6.5 STRL IVOR (GLOVE) ×1 IMPLANT
GOWN STRL REUS W/TWL LRG LVL3 (GOWN DISPOSABLE) ×2 IMPLANT
KIT TURNOVER KIT A (KITS) ×1 IMPLANT
MANIFOLD NEPTUNE II (INSTRUMENTS) ×1 IMPLANT
NDL HYPO 25X1 1.5 SAFETY (NEEDLE) ×1 IMPLANT
NEEDLE HYPO 25X1 1.5 SAFETY (NEEDLE) ×1 IMPLANT
NS IRRIG 1000ML POUR BTL (IV SOLUTION) ×1 IMPLANT
PACK BASIC LIMB (CUSTOM PROCEDURE TRAY) ×1 IMPLANT
PAD ARMBOARD 7.5X6 YLW CONV (MISCELLANEOUS) ×1 IMPLANT
SET BASIN LINEN APH (SET/KITS/TRAYS/PACK) ×1 IMPLANT
SPONGE T-LAP 18X18 ~~LOC~~+RFID (SPONGE) ×1 IMPLANT
SUT ETHILON 3 0 FSL (SUTURE) ×1 IMPLANT
SUT PROLENE 2 0 FS (SUTURE) IMPLANT
SUT VIC AB 2-0 CT2 27 (SUTURE) IMPLANT
SYR CONTROL 10ML LL (SYRINGE) ×1 IMPLANT

## 2022-04-15 NOTE — Evaluation (Signed)
Occupational Therapy Evaluation Patient Details Name: Jon Chandler MRN: [id number] DOB: 1970/09/08 Today's Date: 04/15/2022   History of Present Illness Adynn Caseres Rosetti-year-old male with extensive history of presumed pain and swelling.  Patient reports  That he was treated with a round of antibiotics of clindamycin over past week with no improvement.   Stated erythema edema signs of infection started on her left great toe extending to his foot now down to his leg up to his thigh area.  Denies remembering any trauma or open wound to the left foot/toe area.  Pain swelling and redness has gotten worse since yesterday. (Per MD)   Clinical Impression   Pt agreeable to OT and PT co-evaluation. Pt assisted some at baseline by wife who can provide 24/7 assist. Pt requires some lower body dressing assist due to edema in L LE. Pt able to ambulate well in hall and room with use of crutches or RW. Pt reported feeling good about his ability to dress and care for himself. Will defer to PT for mobility. Pt is not recommended for further acute OT services and will be discharged to care of nursing staff for remaining length of stay.       Recommendations for follow up therapy are one component of a multi-disciplinary discharge planning process, led by the attending physician.  Recommendations may be updated based on patient status, additional functional criteria and insurance authorization.   Follow Up Recommendations  No OT follow up     Assistance Recommended at Discharge Intermittent Supervision/Assistance  Patient can return home with the following A little help with bathing/dressing/bathroom;Assistance with cooking/housework    Functional Status Assessment  Patient has not had a recent decline in their functional status  Equipment Recommendations  None recommended by OT    Recommendations for Other Services       Precautions / Restrictions Precautions Precautions:  Fall Restrictions Weight Bearing Restrictions: No      Mobility Bed Mobility Overal bed mobility: Modified Independent                  Transfers Overall transfer level: Needs assistance Equipment used: Rolling walker (2 wheels), Crutches Transfers: Sit to/from Stand, Bed to chair/wheelchair/BSC Sit to Stand: Supervision     Step pivot transfers: Supervision     General transfer comment: Pt used RW and crutches during ambulatory transfers. PT provided education on use of both.      Balance Overall balance assessment: Needs assistance Sitting-balance support: Feet supported, No upper extremity supported Sitting balance-Leahy Scale: Good Sitting balance - Comments: seated at EOB   Standing balance support: During functional activity, Reliant on assistive device for balance, Bilateral upper extremity supported Standing balance-Leahy Scale: Fair Standing balance comment: using RW and crutches.                           ADL either performed or assessed with clinical judgement   ADL Overall ADL's : Needs assistance/impaired     Grooming: Supervision/safety;Standing       Lower Body Bathing: Moderate assistance;Sitting/lateral leans       Lower Body Dressing: Moderate assistance;Sitting/lateral leans Lower Body Dressing Details (indicate cue type and reason): Pt able to doff and don R sock; assist needed for L sock; completed at EOB. Toilet Transfer: Supervision/safety;Min guard;Rolling walker (2 wheels) (and crutches) Toilet Transfer Details (indicate cue type and reason): Simulated via ambulatory transfer in hall with return to bed.  Functional mobility during ADLs: Supervision/safety;Min guard;Rolling walker (2 wheels) (crutches as well) General ADL Comments: Pt able to ambualte over 100 feet in hall and complete going up and down a couple steps.     Vision Baseline Vision/History: 1 Wears glasses Ability to See in Adequate Light: 0  Adequate Patient Visual Report: No change from baseline Vision Assessment?: No apparent visual deficits                Pertinent Vitals/Pain Pain Assessment Pain Assessment: 0-10 Pain Score: 8  Pain Location: L leg and quad area. Pain Descriptors / Indicators: Sore Pain Intervention(s): Limited activity within patient's tolerance, Monitored during session, Repositioned     Hand Dominance Right   Extremity/Trunk Assessment Upper Extremity Assessment Upper Extremity Assessment: Overall WFL for tasks assessed   Lower Extremity Assessment Lower Extremity Assessment: Defer to PT evaluation   Cervical / Trunk Assessment Cervical / Trunk Assessment: Normal   Communication Communication Communication: No difficulties   Cognition Arousal/Alertness: Awake/alert Behavior During Therapy: WFL for tasks assessed/performed Overall Cognitive Status: Within Functional Limits for tasks assessed                                                        Home Living Family/patient expects to be discharged to:: Private residence Living Arrangements: Spouse/significant other Available Help at Discharge: Family;Available 24 hours/day Type of Home: House Home Access: Stairs to enter [employer] of Steps: 2 Entrance Stairs-Rails: Right;Left Home Layout: One level     Bathroom Shower/Tub: Teacher, early years/pre: Standard Bathroom Accessibility: Yes How Accessible: Accessible via wheelchair;Accessible via walker Home Equipment: None          Prior Functioning/Environment Prior Level of Function : Needs assist       Physical Assist : ADLs (physical);Mobility (physical) Mobility (physical): Transfers;Gait ADLs (physical): Dressing;Toileting;Bathing;IADLs Mobility Comments: [place] ambulator leaning on walls and assisted by wife. ADLs Comments: Assisted for bathing, dressing, and toileting. IADL assist.                                 Co-evaluation PT/OT/SLP Co-Evaluation/Treatment: Yes Reason for Co-Treatment: To address functional/ADL transfers   OT goals addressed during session: ADL's and self-care                       End of Session Equipment Utilized During Treatment: Rolling walker (2 wheels);Other (comment) (crutches)  Activity Tolerance: Patient tolerated treatment well Patient left: in bed;with call bell/phone within reach;with family/visitor present  OT Visit Diagnosis: Unsteadiness on feet (R26.81);Other abnormalities of gait and mobility (R26.89)                Time: 9892-1194 OT Time Calculation (min): 15 min Charges:  OT General Charges $OT Visit: 1 Visit OT Evaluation $OT Eval Low Complexity: 1 Low  Trellis Vanoverbeke OT, MOT  Larey Seat 04/15/2022, 9:27 AM

## 2022-04-15 NOTE — Progress Notes (Signed)
[facility] Progress Note  Day of Surgery  Subjective: Patient seen and examined.  He is resting comfortably in bed.  He has some pain in his left ankle, but is otherwise doing well.  He is agreeable to surgery today.  Objective: Vital signs in last 24 hours: Temp:  [97.6 F (36.4 C)-98.6 F (37 C)] 98.6 F (37 C) (02/08 1222) Pulse Rate:  [72-90] 72 (02/08 1207) Resp:  [14-20] 16 (02/08 1207) BP: (119-149)/(66-91) 132/87 (02/08 1207) SpO2:  [97 %-100 %] 97 % (02/08 1207) Weight:  [114.4 kg] 114.4 kg (02/08 1207) Last BM Date : 04/12/22  Intake/Output from previous day: 02/07 0701 - 02/08 0700 In: 960 [P.O.:960] Out: 2650 [Urine:2650] Intake/Output this shift: Total I/O In: 700.1 [IV Piggyback:700.1] Out: 900 [Urine:900]  General appearance: alert, cooperative, and no distress Extremities: left shin with improving erythema,mostly healed left great toe ulcer and scabbed area at the left lateral aspect of great toe, no significant erythema of left great toe  Lab Results:  Recent Labs    04/14/22 0436 04/15/22 0314  WBC 9.0 8.0  HGB 10.1* 9.8*  HCT 32.8* 32.0*  PLT 498* 531*   BMET Recent Labs    04/14/22 0436 04/15/22 0314  NA 132* 134*  K 3.4* 3.9  CL 96* 100  CO2 25 27  GLUCOSE 90 94  BUN 7 5*  CREATININE 0.59* 0.59*  CALCIUM 8.7* 8.5*   PT/INR Recent Labs    04/13/22 1220 04/14/22 0436  LABPROT 12.6 13.1  INR 1.0 1.0    Studies/Results: US ARTERIAL ABI (SCREENING LOWER EXTREMITY)  Result Date: 04/14/2022 CLINICAL DATA:  Left first toe osteomyelitis and dorsal foot wound. History of diabetes. EXAM: NONINVASIVE PHYSIOLOGIC VASCULAR STUDY OF BILATERAL LOWER EXTREMITIES TECHNIQUE: Evaluation of both lower extremities were performed at rest, including calculation of ankle-brachial indices with single level Doppler, pressure and pulse volume recording. COMPARISON:  None Available. FINDINGS: Right ABI:  1.25 Left ABI:  1.19 Right Lower  Extremity: Biphasic posterior tibial and monophasic dorsalis pedis waveforms. Left Lower Extremity: Biphasic posterior tibial and dorsalis pedis waveforms. 1.0-1.4 Normal IMPRESSION: Normal resting ankle-brachial indices with some distal waveform abnormalities. Findings suggest no significant arterial occlusive disease in either lower extremity but some component of probable tibial disease bilaterally. Electronically Signed   By: Aletta Edouard M.D.   On: 04/14/2022 12:57   US Venous Img Lower Bilateral (DVT)  Result Date: 04/14/2022 CLINICAL DATA:  9965 Edema 9965 [id number] Pain 144615 EXAM: BILATERAL LOWER EXTREMITY VENOUS DOPPLER ULTRASOUND TECHNIQUE: Gray-scale sonography with graded compression, as well as color Doppler and duplex ultrasound were performed to evaluate the lower extremity deep venous systems from the level of the common femoral vein and including the common femoral, femoral, profunda femoral, popliteal and calf veins including the posterior tibial, peroneal and gastrocnemius veins when visible. The superficial great saphenous vein was also interrogated. Spectral Doppler was utilized to evaluate flow at rest and with distal augmentation maneuvers in the common femoral, femoral and popliteal veins. COMPARISON:  LEFT foot XRs, concurrent. FINDINGS: RIGHT LOWER EXTREMITY VENOUS Heterogeneously hypoechoic filling defect within the imaged portions of peripheral femoral vein and the paired peroneal veins, with incomplete compressibility. Normal compressibility of the RIGHT common femoral, popliteal, as well as the visualized portions of profunda femoral and great saphenous veins. OTHER No evidence of superficial thrombophlebitis or abnormal fluid collection. Limitations: none LEFT LOWER EXTREMITY VENOUS Heterogeneously hypoechoic filling defect within the imaged portions of 1 of the paired posterior tibial  and peroneal veins, with incomplete compressibility. Normal compressibility of the LEFT common  femoral, superficial femoral, and popliteal veins. Visualized portions of profunda femoral vein and great saphenous vein unremarkable. OTHER No evidence of superficial thrombophlebitis or abnormal fluid collection. Limitations: none Pathologically-enlarged LEFT inguinal lymph node. IMPRESSION: 1. Examination is POSITIVE for bilateral lower extremity DVT within the RIGHT femoral vein and LEFT calf veins, as above. 2. No evidence of superficial thrombophlebitis within either lower extremity. 3. Pathologically-enlarged LEFT inguinal lymph node, likely reactive. Michaelle Birks, MD [facility] Radiology Electronically Signed   By: Michaelle Birks M.D.   On: 04/14/2022 10:34   MR FOOT LEFT WO CONTRAST  Result Date: 04/13/2022 CLINICAL DATA:  Foot infection, query osteomyelitis EXAM: MRI OF THE LEFT FOOT WITHOUT CONTRAST TECHNIQUE: Multiplanar, multisequence MR imaging of the left foot was performed. No intravenous contrast was administered. COMPARISON:  Radiographs 04/13/2022 FINDINGS: Bones/Joint/Cartilage Abnormal edema and bony destructive findings in the proximal and distal phalanges of the great toe compatible with active osteomyelitis. Probable septic interphalangeal joint with complex joint effusion. Faint and patchy edema signal in the lateral cuneiform and cuboid, likely degenerative. Subtle and likely degenerative marrow edema proximally in the second metatarsal. Ligaments Lisfranc ligament intact Muscles and Tendons Mild regional muscular atrophy. Low-grade diffuse muscular edema is likely neurogenic given the diffuse nature. Soft tissues Diffuse subcutaneous edema especially dorsally along the foot. Bilobulated 2.4 by 0.7 by 1.0 cm lesion in the subcutaneous tissues of the dorsum of the foot (image 25, series 7 with accentuated T1 signal, possibly representing thrombosed venous structure or proteinaceous fluid in a cystic lesion. It would be unusual for an abscess  to have high T1 signal although given the surrounding edema and potential cellulitis, a small abscess is difficult to totally rule out. IMPRESSION: 1. Active osteomyelitis of the proximal and distal phalanges of the great toe with suspected septic interphalangeal joint. 2. Bilobulated 2.4 by 0.7 by 1.0 cm lesion in the subcutaneous tissues of the dorsum of the foot with accentuated T1 signal, possibly representing thrombosed venous structure or proteinaceous fluid in a cystic lesion. It would be unusual for an abscess to have high T1 signal although given the surrounding edema and potential cellulitis, a small abscess is difficult to totally rule out. 3. Diffuse subcutaneous edema especially dorsally along the foot. 4. Low-grade diffuse muscular edema is likely neurogenic given the diffuse nature. Electronically Signed   By: Van Clines M.D.   On: 04/13/2022 14:54    Anti-infectives: Anti-infectives (From admission, onward)    Start     Dose/Rate Route Frequency Ordered Stop   04/14/22 0400  [MAR Hold]  vancomycin (VANCOREADY) IVPB 1750 mg/350 mL        (MAR Hold since Thu 04/15/2022 at 1158.Hold Reason: Transfer to a Procedural area)   1,750 mg 175 mL/hr over 120 Minutes Intravenous Every 12 hours 04/13/22 1451     04/13/22 1700  meropenem (MERREM) 1 g in sodium chloride 0.9 % 100 mL IVPB  Status:  Discontinued        1 g 200 mL/hr over 30 Minutes Intravenous Every 8 hours 04/13/22 1503 04/13/22 1640   04/13/22 1700  [MAR Hold]  ceFAZolin (ANCEF) IVPB 2g/100 mL premix        (MAR Hold since Thu 04/15/2022 at 1158.Hold Reason: Transfer to a Procedural area)   2 g 200 mL/hr over 30 Minutes Intravenous Every 8 hours 04/13/22 1640     04/13/22 1500  clindamycin (  CLEOCIN) IVPB 600 mg  Status:  Discontinued        600 mg 100 mL/hr over 30 Minutes Intravenous Every 8 hours 04/13/22 1412 04/13/22 1640   04/13/22 1415  vancomycin (VANCOCIN) IVPB 1000 mg/200 mL premix  Status:  Discontinued        1,000  mg 200 mL/hr over 60 Minutes Intravenous  Once 04/13/22 1412 04/13/22 1413   04/13/22 1415  piperacillin-tazobactam (ZOSYN) IVPB 3.375 g  Status:  Discontinued        3.375 g 100 mL/hr over 30 Minutes Intravenous  Once 04/13/22 1412 04/13/22 1503   04/13/22 1415  vancomycin (VANCOREADY) IVPB 2000 mg/400 mL        2,000 mg 200 mL/hr over 120 Minutes Intravenous  Once 04/13/22 1413 04/13/22 1842   04/13/22 1200  cefTRIAXone (ROCEPHIN) 2 g in sodium chloride 0.9 % 100 mL IVPB        2 g 200 mL/hr over 30 Minutes Intravenous  Once 04/13/22 1145 04/13/22 1312       Assessment/Plan:  Patient is a 52 year old male who was admitted with osteomyelitis of the left great toe and cellulitis of the left lower extremity.  -Plan for left great toe amputation today -NPO for procedure -Patient will be okay for diet after surgery -IV antibiotics per ID -Patient without leukocytosis -PRN pain control and antiemetics -Will plan to send tissue for culture -Appreciate hospitalist recommendations   LOS: 2 days    Anniemae Haberkorn A Remberto Lienhard 04/15/2022

## 2022-04-15 NOTE — Op Note (Signed)
[facility] Operative Note  04/15/22  Preoperative Diagnosis: Left great toe osteomyelitis   Postoperative Diagnosis: Same   Procedure(s) Performed: Left great toe amputation   Surgeon: Graciella Freer, DO    Assistants: No qualified resident was available    Anesthesia: LMA   Anesthesiologist: Dr. Briant Cedar   Specimens: Left great toe, tissue culture from left great toe   Estimated Blood Loss: 40 cc   Blood Replacement: None    Complications: None   Wound Class: Dirty/Infected   Operative Indications: Patient is a 52 year old male who has a 52-month history of left great toe wound.  He was admitted to the hospital 2 days ago for cellulitis of his left lower extremity.  At that time, he underwent imaging of his left foot which demonstrated acute osteomyelitis of his great toe.  He is agreeable to left great toe amputation.  All risks and benefits of performing this procedure were discussed with the patient including pain, infection, bleeding, damage to the surrounding structures, and need for more procedures or surgery. The patient voiced understanding of the procedure, all questions were sought and answered, and consent was obtained.  Findings: Left great toe with significant edema and inflammation, first metatarsal head without any erosive bony changes   Procedure: The patient was taken to the operating room and placed supine. LMA anesthesia was induced. Patient is on  IV antibiotics on the floor.  The left foot was prepared and draped in the usual sterile fashion.  A time-out was completed verifying correct patient, procedure, site, positioning, and implant(s) and/or special equipment prior to beginning this procedure.  An elliptical incision was made around the great toe proximal phalanx and carried down the medial aspect of the foot with sharp dissection. I amputated the toe at the joint between the proximal phalanx and metatarsal head as the osteomyelitis  was in the distal and proximal phalanx. The cavity was irrigated. The area was localized with marcaine.  Hemostasis was achieved. I closed the tissue over the metatarsal head with 2-0 Vicryl interrupted. I closed the skin with 3-0 Nylon sutures in an interrupted vertical mattress fashion to close the overlying skin. The incision was covered with xeroform gauze, gauze, kerlix and ACE wrap.   Final inspection revealed acceptable hemostasis. All counts were correct at the end of the case. The patient was awakened from anesthesia without complication.  The patient went to the PACU in stable condition.   Graciella Freer, DO  [facility] [address] [place], [place] [address] [phone] (office)

## 2022-04-15 NOTE — Anesthesia Procedure Notes (Signed)
Procedure Name: LMA Insertion Date/Time: 04/15/2022 1:05 PM  Performed by: Eulas Post, Eean Buss W, CRNAPre-anesthesia Checklist: Patient identified, Emergency Drugs available, Suction available and Patient being monitored Patient Re-evaluated:Patient Re-evaluated prior to induction Oxygen Delivery Method: Circle system utilized Preoxygenation: Pre-oxygenation with 100% oxygen Induction Type: IV induction Ventilation: Mask ventilation without difficulty LMA: LMA inserted LMA Size: 5.0 Number of attempts: 1 Placement Confirmation: positive ETCO2 and breath sounds checked- equal and bilateral Tube secured with: Tape Dental Injury: Teeth and Oropharynx as per pre-operative assessment

## 2022-04-15 NOTE — Anesthesia Preprocedure Evaluation (Signed)
Anesthesia Evaluation  Patient identified by MRN, date of birth, ID band Patient awake    Reviewed: Allergy & Precautions, H&P , NPO status , Patient's Chart, lab work & pertinent test results, reviewed documented beta blocker date and time   Airway Mallampati: II  TM Distance: >3 FB Neck ROM: full    Dental no notable dental hx.    Pulmonary neg pulmonary ROS, Current Smoker and Patient abstained from smoking.   Pulmonary exam normal breath sounds clear to auscultation       Cardiovascular Exercise Tolerance: Good + Peripheral Vascular Disease   Rhythm:regular Rate:Normal     Neuro/Psych negative neurological ROS  negative psych ROS   GI/Hepatic negative GI ROS, Neg liver ROS,,,  Endo/Other  negative endocrine ROSdiabetes, Type 2    Renal/GU negative Renal ROS  negative genitourinary   Musculoskeletal   Abdominal   Peds  Hematology negative hematology ROS (+)   Anesthesia Other Findings   Reproductive/Obstetrics negative OB ROS                             Anesthesia Physical Anesthesia Plan  ASA: 3 and emergent  Anesthesia Plan: General and General LMA   Post-op Pain Management:    Induction:   PONV Risk Score and Plan: Ondansetron  Airway Management Planned:   Additional Equipment:   Intra-op Plan:   Post-operative Plan:   Informed Consent: I have reviewed the patients History and Physical, chart, labs and discussed the procedure including the risks, benefits and alternatives for the proposed anesthesia with the patient or authorized representative who has indicated his/her understanding and acceptance.     Dental Advisory Given  Plan Discussed with: CRNA  Anesthesia Plan Comments:        Anesthesia Quick Evaluation

## 2022-04-15 NOTE — Progress Notes (Signed)
PROGRESS NOTE    Jon Chandler  HFW:[id number] DOB: 1971-02-19 DOA: 04/13/2022 PCP: Merryl Hacker, No   Brief Narrative:    [name] male with extensive history of presumed pain and swelling.  Patient reports That he was treated with a round of antibiotics of clindamycin over past week with no improvement.  Stated erythema edema signs of infection started on her left great toe extending to his foot now down to his leg up to his thigh area. He has been admitted with osteomyelitis of his left great toe and ID is recommending treatment empirically with cefazolin and vancomycin for now.  ABI appears okay, but Doppler venous studies demonstrating DVT bilaterally.  He was started on full dose Lovenox which has been held today due to anticipated amputation of his great toe per general surgery.  Assessment & Plan:   Principal Problem:   Osteomyelitis ([place]) Active Problems:   Lower extremity cellulitis   DMII (diabetes mellitus, type 2) ([place])  Assessment and Plan:   Osteomyelitis ([place]) Osteomyelitis left great toe -MRI of the foot: MPRESSION: 1. Active osteomyelitis of the proximal and distal phalanges of the great toe with suspected septic interphalangeal joint. 2. Bilobulated 2.4 by 0.7 by 1.0 cm lesion in the subcutaneous tissues of the dorsum of the foot possibly representing thrombosed venous structure or proteinaceous fluid in a cystic lesion.    -Plan for great toe amputation today -ABI within normal limits -Currently n.p.o. -Holding full dose Lovenox -Appreciate ongoing ID recommendations   Lower extremity cellulitis - Cellulitis left foot starting from the toe streaking to the foot bacterial leg up to his thigh area -Completed outpatient treatment with p.o. antibiotics of clindamycin with no improvement and actually worsened -Ruling out osteomyelitis pursuing MRI of the left foot   X-ray of the left foot:  IMPRESSION: 1. New moderate to high-grade  fragmentation/erosion of the distal phalanx of the great toe and the distal most aspect of the proximal phalanx of the great toe. Findings are highly suspicious for acute osteomyelitis     -Plan to continue current IV antibiotics  Bilateral lower extremity DVT -Noted on Doppler studies 2/7 -Started on full dose Lovenox 2/7, which has now been held   DMII (diabetes mellitus, type 2) ([place]) - Currently not on any medications -Patient states he was told borderline diabetic  - checking CBG q. ACHS, SSI coverage -Diabetic diet -hemoglobin A1c 5.9%  Obesity BMI 31.03    DVT prophylaxis: Full dose Lovenox currently held Code Status: Full Family Communication: Spouse at bedside 2/8 Disposition Plan:  Status is: Inpatient Remains inpatient appropriate because: Need for IV medications   Consultants:  ID General surgery  Procedures:  None  Antimicrobials:  Anti-infectives (From admission, onward)    Start     Dose/Rate Route Frequency Ordered Stop   04/14/22 0400  vancomycin (VANCOREADY) IVPB 1750 mg/350 mL        1,750 mg 175 mL/hr over 120 Minutes Intravenous Every 12 hours 04/13/22 1451     04/13/22 1700  meropenem (MERREM) 1 g in sodium chloride 0.9 % 100 mL IVPB  Status:  Discontinued        1 g 200 mL/hr over 30 Minutes Intravenous Every 8 hours 04/13/22 1503 04/13/22 1640   04/13/22 1700  ceFAZolin (ANCEF) IVPB 2g/100 mL premix        2 g 200 mL/hr over 30 Minutes Intravenous Every 8 hours 04/13/22 1640     04/13/22 1500  clindamycin (CLEOCIN) IVPB 600 mg  Status:  Discontinued        600 mg 100 mL/hr over 30 Minutes Intravenous Every 8 hours 04/13/22 1412 04/13/22 1640   04/13/22 1415  vancomycin (VANCOCIN) IVPB 1000 mg/200 mL premix  Status:  Discontinued        1,000 mg 200 mL/hr over 60 Minutes Intravenous  Once 04/13/22 1412 04/13/22 1413   04/13/22 1415  piperacillin-tazobactam (ZOSYN) IVPB 3.375 g  Status:  Discontinued        3.375 g 100 mL/hr over 30  Minutes Intravenous  Once 04/13/22 1412 04/13/22 1503   04/13/22 1415  vancomycin (VANCOREADY) IVPB 2000 mg/400 mL        2,000 mg 200 mL/hr over 120 Minutes Intravenous  Once 04/13/22 1413 04/13/22 1842   04/13/22 1200  cefTRIAXone (ROCEPHIN) 2 g in sodium chloride 0.9 % 100 mL IVPB        2 g 200 mL/hr over 30 Minutes Intravenous  Once 04/13/22 1145 04/13/22 1312      Subjective: Patient seen and evaluated today with no new acute complaints or concerns. No acute concerns or events noted overnight.  Continues to have bilateral lower extremity pain and is anticipating toe amputation today.  Objective: Vitals:   04/14/22 1427 04/14/22 2140 04/15/22 0318 04/15/22 0500  BP: 119/66 129/74 (!) 149/91   Pulse: 78 85 90   Resp: 20 18 14    Temp: 97.6 F (36.4 C) 98.2 F (36.8 C) 98.4 F (36.9 C)   TempSrc: Oral     SpO2: 97% 99% 100%   Weight:    114.4 kg  Height:        Intake/Output Summary (Last 24 hours) at 04/15/2022 1005 Last data filed at 04/15/2022 0745 Gross per 24 hour  Intake 1420.05 ml  Output 3150 ml  Net -1729.95 ml   Filed Weights   04/13/22 1622 04/14/22 0500 04/15/22 0500  Weight: 110 kg 112.6 kg 114.4 kg    Examination:  General exam: Appears calm and comfortable  Respiratory system: Clear to auscultation. Respiratory effort normal. Cardiovascular system: S1 & S2 heard, RRR.  Gastrointestinal system: Abdomen is soft Central nervous system: Alert and awake Extremities: Bilateral lower extremity edema and erythema Skin: No significant lesions noted Psychiatry: Flat affect.    Data Reviewed: I have personally reviewed following labs and imaging studies  CBC: Recent Labs  Lab 04/13/22 1220 04/14/22 0436 04/15/22 0314  WBC 11.3* 9.0 8.0  NEUTROABS 8.5*  --   --   HGB 13.4 10.1* 9.8*  HCT 43.7 32.8* 32.0*  MCV 79.3* 80.0 79.6*  PLT 613* 498* 531*   Basic Metabolic Panel: Recent Labs  Lab 04/13/22 1220 04/14/22 0436 04/15/22 0314  NA 135 132*  134*  K 3.6 3.4* 3.9  CL 97* 96* 100  CO2 23 25 27   GLUCOSE 94 90 94  BUN 7 7 5*  CREATININE 0.66 0.59* 0.59*  CALCIUM 10.0 8.7* 8.5*  MG 2.2  --  2.0  PHOS 3.8  --   --    GFR: Estimated Creatinine Clearance: 147.4 mL/min (A) (by C-G formula based on SCr of 0.59 mg/dL (L)). Liver Function Tests: Recent Labs  Lab 04/13/22 1220  AST 27  ALT 25  ALKPHOS 114  BILITOT 0.5  PROT 9.9*  ALBUMIN 3.6   No results for input(s): "LIPASE", "AMYLASE" in the last 168 hours. No results for input(s): "AMMONIA" in the last 168 hours. Coagulation Profile: Recent Labs  Lab 04/13/22 1220 04/14/22 0436  INR 1.0  1.0   Cardiac Enzymes: No results for input(s): "CKTOTAL", "CKMB", "CKMBINDEX", "TROPONINI" in the last 168 hours. BNP (last 3 results) No results for input(s): "PROBNP" in the last 8760 hours. HbA1C: Recent Labs    04/13/22 1220  HGBA1C 5.9*   CBG: Recent Labs  Lab 04/14/22 0804 04/14/22 1150 04/14/22 1640 04/14/22 2141 04/15/22 0733  GLUCAP 78 86 107* 91 77   Lipid Profile: No results for input(s): "CHOL", "HDL", "LDLCALC", "TRIG", "CHOLHDL", "LDLDIRECT" in the last 72 hours. Thyroid Function Tests: No results for input(s): "TSH", "T4TOTAL", "FREET4", "T3FREE", "THYROIDAB" in the last 72 hours. Anemia Panel: No results for input(s): "VITAMINB12", "FOLATE", "FERRITIN", "TIBC", "IRON", "RETICCTPCT" in the last 72 hours. Sepsis Labs: Recent Labs  Lab 04/13/22 1220 04/13/22 1351  LATICACIDVEN 1.8 0.9    Recent Results (from the past 240 hour(s))  Blood culture (routine x 2)     Status: None (Preliminary result)   Collection Time: 04/13/22 12:20 PM   Specimen: BLOOD  Result Value Ref Range Status   Specimen Description BLOOD RIGHT ARM  Final   Special Requests   Final    BOTTLES DRAWN AEROBIC AND ANAEROBIC Blood Culture adequate volume   Culture   Final    NO GROWTH 2 DAYS Performed at [facility], [address], [place], [place] [address]    Report  Status PENDING  Incomplete  Blood culture (routine x 2)     Status: None (Preliminary result)   Collection Time: 04/13/22 12:20 PM   Specimen: BLOOD  Result Value Ref Range Status   Specimen Description BLOOD RIGHT ASSIST CONTROL  Final   Special Requests   Final    BOTTLES DRAWN AEROBIC AND ANAEROBIC Blood Culture adequate volume   Culture   Final    NO GROWTH 2 DAYS Performed at [facility], [address] [place], [place] [address]    Report Status PENDING  Incomplete  Surgical pcr screen     Status: None   Collection Time: 04/14/22  1:03 PM   Specimen: Nasal Mucosa; Nasal Swab  Result Value Ref Range Status   MRSA, PCR NEGATIVE NEGATIVE Final   Staphylococcus aureus NEGATIVE NEGATIVE Final    Comment: (NOTE) The Xpert SA Assay (FDA approved for NASAL specimens in patients 33 years of age and older), is one component of a comprehensive surveillance program. It is not intended to diagnose infection nor to guide or monitor treatment. Performed at [facility], [address], [place], [place] [address]          Radiology Studies: US ARTERIAL ABI (SCREENING LOWER EXTREMITY)  Result Date: 04/14/2022 CLINICAL DATA:  Left first toe osteomyelitis and dorsal foot wound. History of diabetes. EXAM: NONINVASIVE PHYSIOLOGIC VASCULAR STUDY OF BILATERAL LOWER EXTREMITIES TECHNIQUE: Evaluation of both lower extremities were performed at rest, including calculation of ankle-brachial indices with single level Doppler, pressure and pulse volume recording. COMPARISON:  None Available. FINDINGS: Right ABI:  1.25 Left ABI:  1.19 Right Lower Extremity: Biphasic posterior tibial and monophasic dorsalis pedis waveforms. Left Lower Extremity: Biphasic posterior tibial and dorsalis pedis waveforms. 1.0-1.4 Normal IMPRESSION: Normal resting ankle-brachial indices with some distal waveform abnormalities. Findings suggest no significant arterial occlusive disease in either lower extremity but some component  of probable tibial disease bilaterally. Electronically Signed   By: Aletta Edouard M.D.   On: 04/14/2022 12:57   US Venous Img Lower Bilateral (DVT)  Result Date: 04/14/2022 CLINICAL DATA:  9965 Edema 9965 [id number] Pain [id number] EXAM: BILATERAL LOWER EXTREMITY  VENOUS DOPPLER ULTRASOUND TECHNIQUE: Gray-scale sonography with graded compression, as well as color Doppler and duplex ultrasound were performed to evaluate the lower extremity deep venous systems from the level of the common femoral vein and including the common femoral, femoral, profunda femoral, popliteal and calf veins including the posterior tibial, peroneal and gastrocnemius veins when visible. The superficial great saphenous vein was also interrogated. Spectral Doppler was utilized to evaluate flow at rest and with distal augmentation maneuvers in the common femoral, femoral and popliteal veins. COMPARISON:  LEFT foot XRs, concurrent. FINDINGS: RIGHT LOWER EXTREMITY VENOUS Heterogeneously hypoechoic filling defect within the imaged portions of peripheral femoral vein and the paired peroneal veins, with incomplete compressibility. Normal compressibility of the RIGHT common femoral, popliteal, as well as the visualized portions of profunda femoral and great saphenous veins. OTHER No evidence of superficial thrombophlebitis or abnormal fluid collection. Limitations: none LEFT LOWER EXTREMITY VENOUS Heterogeneously hypoechoic filling defect within the imaged portions of 1 of the paired posterior tibial and peroneal veins, with incomplete compressibility. Normal compressibility of the LEFT common femoral, superficial femoral, and popliteal veins. Visualized portions of profunda femoral vein and great saphenous vein unremarkable. OTHER No evidence of superficial thrombophlebitis or abnormal fluid collection. Limitations: none Pathologically-enlarged LEFT inguinal lymph node. IMPRESSION: 1. Examination is POSITIVE for bilateral lower extremity DVT within the  RIGHT femoral vein and LEFT calf veins, as above. 2. No evidence of superficial thrombophlebitis within either lower extremity. 3. Pathologically-enlarged LEFT inguinal lymph node, likely reactive. Michaelle Birks, MD [facility] Electronically Signed   By: Michaelle Birks M.D.   On: 04/14/2022 10:34   MR FOOT LEFT WO CONTRAST  Result Date: 04/13/2022 CLINICAL DATA:  Foot infection, query osteomyelitis EXAM: MRI OF THE LEFT FOOT WITHOUT CONTRAST TECHNIQUE: Multiplanar, multisequence MR imaging of the left foot was performed. No intravenous contrast was administered. COMPARISON:  Radiographs 04/13/2022 FINDINGS: Bones/Joint/Cartilage Abnormal edema and bony destructive findings in the proximal and distal phalanges of the great toe compatible with active osteomyelitis. Probable septic interphalangeal joint with complex joint effusion. Faint and patchy edema signal in the lateral cuneiform and cuboid, likely degenerative. Subtle and likely degenerative marrow edema proximally in the second metatarsal. Ligaments Lisfranc ligament intact Muscles and Tendons Mild regional muscular atrophy. Low-grade diffuse muscular edema is likely neurogenic given the diffuse nature. Soft tissues Diffuse subcutaneous edema especially dorsally along the foot. Bilobulated 2.4 by 0.7 by 1.0 cm lesion in the subcutaneous tissues of the dorsum of the foot (image 25, series 7 with accentuated T1 signal, possibly representing thrombosed venous structure or proteinaceous fluid in a cystic lesion. It would be unusual for an abscess to have high T1 signal although given the surrounding edema and potential cellulitis, a small abscess is difficult to totally rule out. IMPRESSION: 1. Active osteomyelitis of the proximal and distal phalanges of the great toe with suspected septic interphalangeal joint. 2. Bilobulated 2.4 by 0.7 by 1.0 cm lesion in the subcutaneous tissues of the dorsum of the foot  with accentuated T1 signal, possibly representing thrombosed venous structure or proteinaceous fluid in a cystic lesion. It would be unusual for an abscess to have high T1 signal although given the surrounding edema and potential cellulitis, a small abscess is difficult to totally rule out. 3. Diffuse subcutaneous edema especially dorsally along the foot. 4. Low-grade diffuse muscular edema is likely neurogenic given the diffuse nature. Electronically Signed   By: Van Clines M.D.   On: 04/13/2022 14:54  DG Foot Complete Left  Result Date: 04/13/2022 CLINICAL DATA:  Infection. Cellulitis. Redness and swelling of left foot. EXAM: LEFT FOOT - COMPLETE 3+ VIEW COMPARISON:  Left foot radiographs 05/14/2016 FINDINGS: There is moderate to high-grade great toe soft tissue swelling. There is new moderate to high-grade fragmentation/erosion of the distal phalanx of the great toe. There is also high-grade erosion of the distal most aspect of the adjacent proximal phalanx of the great toe. There is mild diastasis of the cortical fragments. Minimal medial great toe metatarsophalangeal joint space narrowing. There is a prominent anterior process of the calcaneus that appears to articulate with the lateral aspect of the navicular, with mild-to-moderate joint space narrowing, subchondral sclerosis, and peripheral osteophytosis that is greatest at the plantar/lateral aspect of the joint, best seen on oblique view. Minimal dorsal talonavicular and navicular-cuneiform degenerative osteophytes are unchanged from prior. IMPRESSION: 1. New moderate to high-grade fragmentation/erosion of the distal phalanx of the great toe and the distal most aspect of the proximal phalanx of the great toe. Findings are highly suspicious for acute osteomyelitis. 2. Moderate to high-grade great toe soft tissue swelling. 3. 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 Electronically Signed   By: Yvonne Kendall M.D.   On: 04/13/2022 12:17        Scheduled Meds:  insulin aspart  0-6 Units Subcutaneous TID WC   nicotine  21 mg Transdermal Daily   sodium chloride flush  3 mL Intravenous Q12H   sodium chloride flush  3 mL Intravenous Q12H   Continuous Infusions:  sodium chloride      ceFAZolin (ANCEF) IV 2 g (04/15/22 0739)   vancomycin 1,750 mg (04/15/22 0333)     LOS: 2 days    Time spent: 35 minutes    Deny Chevez D Manuella Ghazi, DO [facility]  If 7PM-7AM, please contact night-coverage [email or web address] 04/15/2022, 10:05 AM

## 2022-04-15 NOTE — Evaluation (Signed)
Physical Therapy Evaluation Patient Details Name: Jon Chandler MRN: [id number] DOB: May 27, 1970 Today's Date: 04/15/2022  History of Present Illness  Jon Chandler Jon Chandler with extensive history of presumed pain and swelling.  Patient reports  That he was treated with a round of antibiotics of clindamycin over past week with no improvement.   Stated erythema edema signs of infection started on her left great toe extending to his foot now down to his leg up to his thigh area.  Denies remembering any trauma or open wound to the left foot/toe area.  Pain swelling and redness has gotten worse since yesterday.   Clinical Impression  Patient demonstrates good return for using RW and Axillary Crutches for ambulation in room and hallways without loss of balance.  Patient prefers use of Axillary Crutches for home use and encouraged to use for safety and to increase independence.  Plan:  Patient discharged from physical therapy to care of nursing for ambulation daily as tolerated for length of stay.        Recommendations for follow up therapy are one component of a multi-disciplinary discharge planning process, led by the attending physician.  Recommendations may be updated based on patient status, additional functional criteria and insurance authorization.  Follow Up Recommendations Home health PT      Assistance Recommended at Discharge Set up Supervision/Assistance  Patient can return home with the following  A little help with walking and/or transfers;A little help with bathing/dressing/bathroom;Help with stairs or ramp for entrance;Assistance with cooking/housework    Equipment Recommendations Crutches;Other (comment) (Axillary Crutches)  Recommendations for Other Services       Functional Status Assessment Patient has had a recent decline in their functional status and demonstrates the ability to make significant improvements in function in a reasonable and predictable amount  of time.     Precautions / Restrictions Precautions Precautions: Fall Restrictions Weight Bearing Restrictions: No      Mobility  Bed Mobility Overal bed mobility: Modified Independent                  Transfers Overall transfer level: Needs assistance Equipment used: Rolling walker (2 wheels), Crutches Transfers: Sit to/from Stand, Bed to chair/wheelchair/BSC Sit to Stand: Supervision   Step pivot transfers: Supervision       General transfer comment: labored movement with limited weightbearing on LLE due to increased pain, had to use RW and axillary crutches    Ambulation/Gait Ambulation/Gait assistance: Modified independent (Device/Increase time) Gait Distance (Feet): 120 Feet Assistive device: Rolling walker (2 wheels), Crutches Gait Pattern/deviations: Decreased step length - right, Decreased step length - left, Decreased stance time - left, Decreased stride length, Antalgic Gait velocity: decreased     General Gait Details: demonstrates good return for ambulating in room/hallway using RW and Axillary Cruthes without loss of balnace  Stairs Stairs: Yes Stairs assistance: Modified independent (Device/Increase time) Stair Management: One rail Left, Step to pattern Number of Stairs: 3 General stair comments: demonstrates good return for going up/down steps in stairwell without loss of balance and understanding acknowledged  Wheelchair Mobility    Modified Rankin (Stroke Patients Only)       Balance Overall balance assessment: Needs assistance Sitting-balance support: Feet supported, No upper extremity supported Sitting balance-Leahy Scale: Good Sitting balance - Comments: seated at EOB   Standing balance support: Reliant on assistive device for balance, During functional activity, Bilateral upper extremity supported Standing balance-Leahy Scale: Fair Standing balance comment: fair/good using RW and Axillary Crutches  Pertinent Vitals/Pain Pain Assessment Pain Assessment: 0-10 Pain Score: 8  Pain Location: L leg and quad area. Pain Descriptors / Indicators: Sore Pain Intervention(s): Limited activity within patient's tolerance, Monitored during session, Repositioned    Home Living Family/patient expects to be discharged to:: Private residence Living Arrangements: Spouse/significant other Available Help at Discharge: Family;Available 24 hours/day Type of Home: House Home Access: Stairs to enter Entrance Stairs-Rails: Psychiatric nurse of Steps: 2   Home Layout: One level Home Equipment: None      Prior Function Prior Level of Function : Needs assist       Physical Assist : ADLs (physical);Mobility (physical) Mobility (physical): Transfers;Gait;Bed mobility;Stairs ADLs (physical): Dressing;Toileting;Bathing;IADLs Mobility Comments: Houshold ambulator leaning on walls and assisted by wife. ADLs Comments: Assisted for bathing, dressing, and toileting. IADL assist.     Hand Dominance   Dominant Hand: Right    Extremity/Trunk Assessment   Upper Extremity Assessment Upper Extremity Assessment: Defer to OT evaluation    Lower Extremity Assessment Lower Extremity Assessment: Overall WFL for tasks assessed;LLE deficits/detail LLE Deficits / Details: grossly -4/5 LLE: Unable to fully assess due to pain LLE Sensation: WNL LLE Coordination: WNL    Cervical / Trunk Assessment Cervical / Trunk Assessment: Normal  Communication   Communication: No difficulties  Cognition Arousal/Alertness: Awake/alert Behavior During Therapy: WFL for tasks assessed/performed Overall Cognitive Status: Within Functional Limits for tasks assessed                                          General Comments      Exercises     Assessment/Plan    PT Assessment All further PT needs can be met in the next venue of care  PT Problem List Decreased  strength;Decreased range of motion;Decreased activity tolerance;Decreased balance;Decreased mobility       PT Treatment Interventions      PT Goals (Current goals can be found in the Care Plan section)  Acute Rehab PT Goals Patient Stated Goal: return home with family to assist PT Goal Formulation: With patient Time For Goal Achievement: 04/15/22 Potential to Achieve Goals: Good    Frequency       Co-evaluation PT/OT/SLP Co-Evaluation/Treatment: Yes Reason for Co-Treatment: To address functional/ADL transfers PT goals addressed during session: Mobility/safety with mobility;Balance;Proper use of DME OT goals addressed during session: ADL's and self-care       AM-PAC PT "6 Clicks" Mobility  Outcome Measure Help needed turning from your back to your side while in a flat bed without using bedrails?: None Help needed moving from lying on your back to sitting on the side of a flat bed without using bedrails?: None Help needed moving to and from a bed to a chair (including a wheelchair)?: A Little Help needed standing up from a chair using your arms (e.g., wheelchair or bedside chair)?: A Little Help needed to walk in hospital room?: A Little Help needed climbing 3-5 steps with a railing? : A Little 6 Click Score: 20    End of Session   Activity Tolerance: Patient tolerated treatment well Patient left: in bed;with call bell/phone within reach;with family/visitor present;with bed alarm set Nurse Communication: Mobility status PT Visit Diagnosis: Unsteadiness on feet (R26.81);Other abnormalities of gait and mobility (R26.89);Muscle weakness (generalized) (M62.81)    Time: 2694-8546 PT Time Calculation (min) (ACUTE ONLY): 23 min   Charges:   PT Evaluation $PT Eval  Moderate Complexity: 1 Mod PT Treatments $Therapeutic Activity: 23-37 mins        12:27 PM, 04/15/22 Lonell Grandchild, MPT Physical Therapist with [facility] 336 [phone] office [phone] mobile  phone

## 2022-04-15 NOTE — Progress Notes (Signed)
[facility]  Spoke with the patient's wife in the patient's room on the floor.  I explained that he tolerated the procedure without difficulty.  He has external stitches that we will get removed in the office in 2 to 3 weeks.  He will be transferred back upstairs once his pain is adequately controlled and he is awake.  I will give him a diet.  Will continue IV antibiotics per the hospitalist and ID teams.  Will have PT work with the patient tomorrow to see if he needs anything further at discharge.  All questions were answered to her expressed satisfaction.  Plan: -Transfer back to the floor -Regular diet -Antibiotics per ID/hospitalist -Await cultures/pathology -PRN pain control and antiemetics -Plan for dressing change tomorrow -Would hold this evening's dose and tomorrow morning's dose of Lovenox -Appreciate hospitalist and ID recommendations  Graciella Freer, DO [facility] [address] [place], [place] [address] [phone] (office)

## 2022-04-15 NOTE — Transfer of Care (Signed)
Immediate Anesthesia Transfer of Care Note  Patient: Jon Chandler  Procedure(s) Performed: AMPUTATION TOE (Left: Toe)  Patient Location: PACU  Anesthesia Type:General  Level of Consciousness: awake  Airway & Oxygen Therapy: Patient Spontanous Breathing  Post-op Assessment: Report given to RN  Post vital signs: Reviewed and stable  Last Vitals:  Vitals Value Taken Time  BP    Temp    Pulse 89 04/15/22 1423  Resp 16 04/15/22 1423  SpO2 100 % 04/15/22 1423  Vitals shown include unvalidated device data.  Last Pain:  Vitals:   04/15/22 1222  TempSrc: Oral  PainSc:          Complications: No notable events documented.

## 2022-04-15 NOTE — Progress Notes (Signed)
ID PROGRESS NOTe  52yo M with left great toe osteomyelitis and lower leg cellulitis  Afebrile. Underwent great toe amputation today :  An elliptical incision was made around the great toe proximal phalanx and carried down the medial aspect of the foot with sharp dissection. I amputated the toe at the joint between the proximal phalanx and metatarsal head as the osteomyelitis was in the distal and proximal phalanx. Tissue cx sent  A/P: left great toe osteomyelitis with associated cellulitis - continue with vancomycin and cefazolin for now - appreciate quick intervention of source control - can transition to orals if needed at time of discharge tp treat cellulitis since osteomyelitis thought to be treated with amputation

## 2022-04-16 DIAGNOSIS — M86072 Acute hematogenous osteomyelitis, left ankle and foot: Secondary | ICD-10-CM | POA: Diagnosis not present

## 2022-04-16 LAB — CBC
HCT: 32.6 % — ABNORMAL LOW (ref 39.0–52.0)
Hemoglobin: 9.8 g/dL — ABNORMAL LOW (ref 13.0–17.0)
MCH: 24.4 pg — ABNORMAL LOW (ref 26.0–34.0)
MCHC: 30.1 g/dL (ref 30.0–36.0)
MCV: 81.1 fL (ref 80.0–100.0)
Platelets: 610 10*3/uL — ABNORMAL HIGH (ref 150–400)
RBC: 4.02 MIL/uL — ABNORMAL LOW (ref 4.22–5.81)
RDW: 19.2 % — ABNORMAL HIGH (ref 11.5–15.5)
WBC: 10.1 10*3/uL (ref 4.0–10.5)
nRBC: 0 % (ref 0.0–0.2)

## 2022-04-16 LAB — GLUCOSE, CAPILLARY
Glucose-Capillary: 134 mg/dL — ABNORMAL HIGH (ref 70–99)
Glucose-Capillary: 146 mg/dL — ABNORMAL HIGH (ref 70–99)
Glucose-Capillary: 181 mg/dL — ABNORMAL HIGH (ref 70–99)
Glucose-Capillary: 137 mg/dL — ABNORMAL HIGH (ref 70–99)

## 2022-04-16 LAB — BASIC METABOLIC PANEL
Anion gap: 7 (ref 5–15)
BUN: 7 mg/dL (ref 6–20)
CO2: 27 mmol/L (ref 22–32)
Calcium: 8.9 mg/dL (ref 8.9–10.3)
Chloride: 102 mmol/L (ref 98–111)
Creatinine, Ser: 0.61 mg/dL (ref 0.61–1.24)
GFR, Estimated: >60 mL/min (ref >60)
Glucose, Bld: 146 mg/dL — ABNORMAL HIGH (ref 70–99)
Potassium: 4.8 mmol/L (ref 3.5–5.1)
Sodium: 136 mmol/L (ref 135–145)

## 2022-04-16 LAB — MAGNESIUM: Magnesium: 2.1 mg/dL (ref 1.7–2.4)

## 2022-04-16 MED ORDER — BACITRACIN ZINC 500 UNIT/GM EX OINT
TOPICAL_OINTMENT | Freq: Every day | CUTANEOUS | Status: DC
  Filled 2022-04-16 (×3): qty 0.9

## 2022-04-16 MED ORDER — APIXABAN 5 MG PO TABS: 5.0000 mg | ORAL_TABLET | Freq: Two times a day (BID) | ORAL | Status: DC

## 2022-04-16 MED ORDER — SODIUM CHLORIDE 0.9 % IV SOLN
2.0000 g | Freq: Three times a day (TID) | INTRAVENOUS | Status: DC
  Administered 2022-04-16 – 2022-04-17 (×4): 2 g via INTRAVENOUS
  Filled 2022-04-16 (×4): qty 12.5

## 2022-04-16 MED ORDER — APIXABAN 5 MG PO TABS
10.0000 mg | ORAL_TABLET | Freq: Two times a day (BID) | ORAL | Status: DC
  Administered 2022-04-16 – 2022-04-17 (×3): 10 mg via ORAL
  Filled 2022-04-16 (×3): qty 2

## 2022-04-16 NOTE — Care Management Important Message (Signed)
Important Message  Patient Details  Name: Jon Chandler MRN: [id number] Date of Birth: 1970/10/29   Medicare Important Message Given:  Yes     Tommy Medal 04/16/2022, 10:30 AM

## 2022-04-16 NOTE — Progress Notes (Signed)
ANTICOAGULATION CONSULT NOTE - Initial Consult  Pharmacy Consult for apixaban Indication: DVT  Allergies  Allergen Reactions   Penicillins     Other Reaction(s): Other - See Comments  As infant unsure    Patient Measurements: Height: 6' 3"$  (190.5 cm) Weight: 113.9 kg (251 lb 1.7 oz) IBW/kg (Calculated) : 84.5  Vital Signs: Temp: 98.5 F (36.9 C) (02/09 0412) Temp Source: Oral (02/09 0412) BP: 118/76 (02/09 0412) Pulse Rate: 77 (02/09 0412)  Labs: Recent Labs    04/13/22 1220 04/14/22 0436 04/15/22 0314 04/16/22 0400  HGB 13.4 10.1* 9.8* 9.8*  HCT 43.7 32.8* 32.0* 32.6*  PLT 613* 498* 531* 610*  APTT  --  33  --   --   LABPROT 12.6 13.1  --   --   INR 1.0 1.0  --   --   CREATININE 0.66 0.59* 0.59* 0.61     Estimated Creatinine Clearance: 147.1 mL/min (by C-G formula based on SCr of 0.61 mg/dL).   Medical History: Past Medical History:  Diagnosis Date   Diabetes mellitus without complication [facility])     Assessment: 52 year old male presenting to [facility] with cellulitis after failing outpatient therapy. 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 Consult to dose apixaban.    Goal of Therapy:  Anti-Xa level 0.6-1 units/ml 4hrs after LMWH dose given Monitor platelets by anticoagulation protocol: Yes   Plan:  Start apixaban 10 mg twice daily x 7 days followed by apixaban 5 mg twice daily. Monitor H&H and s/s of bleeding.  Margot Ables, PharmD Clinical Pharmacist 04/16/2022 12:03 PM

## 2022-04-16 NOTE — Anesthesia Postprocedure Evaluation (Signed)
Anesthesia Post Note  Patient: Jon Chandler  Procedure(s) Performed: AMPUTATION TOE (Left: Toe)  Patient location during evaluation: Phase II Anesthesia Type: General Level of consciousness: awake Pain management: pain level controlled Vital Signs Assessment: post-procedure vital signs reviewed and stable Respiratory status: spontaneous breathing and respiratory function stable Cardiovascular status: blood pressure returned to baseline and stable Postop Assessment: no headache and no apparent nausea or vomiting Anesthetic complications: no Comments: Late entry   No notable events documented.   Last Vitals:  Vitals:   04/15/22 2144 04/16/22 0412  BP: 129/74 118/76  Pulse: 79 77  Resp: 18 20  Temp: 36.6 C 36.9 C  SpO2: 96% 100%    Last Pain:  Vitals:   04/16/22 0855  TempSrc:   PainSc: [place]

## 2022-04-16 NOTE — Progress Notes (Signed)
ID PROGRESS NOTE   52yo M with left great toe amputation, cultures showing providencia. But still sensitivities are pending.   - will discontinue cefazolin and start with cefepime - await for sensitivities to help decide if can do oral abtx  Dr Lucianne Lei dam available over the weekend for questions, and give recs.  Jon Chandler [facility] [phone]

## 2022-04-16 NOTE — Progress Notes (Signed)
[facility] Progress Note  1 Day Post-Op  Subjective: Patient seen and examined.  He is resting comfortably in bed.  He denies significant pain associated with his left foot.  He was able to ambulate a little bit in his room when getting up to go to the bathroom.  States he normally ambulates by bearing weight on his heel.  Objective: Vital signs in last 24 hours: Temp:  [97.4 F (36.3 C)-98.6 F (37 C)] 98.5 F (36.9 C) (02/09 0412) Pulse Rate:  [72-79] 77 (02/09 0412) Resp:  [16-20] 20 (02/09 0412) BP: (105-132)/(74-92) 118/76 (02/09 0412) SpO2:  [96 %-100 %] 100 % (02/09 0412) Weight:  [113.9 kg-114.4 kg] 113.9 kg (02/09 0407) Last BM Date : 04/12/22  Intake/Output from previous day: 02/08 0701 - 02/09 0700 In: 2590.1 [P.O.:720; I.V.:520; IV Piggyback:1350.1] Out: 3540 [Urine:3500; Blood:40] Intake/Output this shift: Total I/O In: 240 [P.O.:240] Out: 300 [Urine:300]  General appearance: alert, cooperative, and no distress Extremities: Left toe incision site C/D/I, no significant erythema or drainage; stable erythema to left shin  Lab Results:  Recent Labs    04/15/22 0314 04/16/22 0400  WBC 8.0 10.1  HGB 9.8* 9.8*  HCT 32.0* 32.6*  PLT 531* 610*   BMET Recent Labs    04/15/22 0314 04/16/22 0400  NA 134* 136  K 3.9 4.8  CL 100 102  CO2 27 27  GLUCOSE 94 146*  BUN 5* 7  CREATININE 0.59* 0.61  CALCIUM 8.5* 8.9   PT/INR Recent Labs    04/13/22 1220 04/14/22 0436  LABPROT 12.6 13.1  INR 1.0 1.0    Studies/Results: No results found.  Anti-infectives: Anti-infectives (From admission, onward)    Start     Dose/Rate Route Frequency Ordered Stop   04/16/22 1100  ceFEPIme (MAXIPIME) 2 g in sodium chloride 0.9 % 100 mL IVPB        2 g 200 mL/hr over 30 Minutes Intravenous Every 8 hours 04/16/22 0940     04/14/22 0400  vancomycin (VANCOREADY) IVPB 1750 mg/350 mL  Status:  Discontinued        1,750 mg 175 mL/hr over 120 Minutes  Intravenous Every 12 hours 04/13/22 1451 04/16/22 1039   04/13/22 1700  meropenem (MERREM) 1 g in sodium chloride 0.9 % 100 mL IVPB  Status:  Discontinued        1 g 200 mL/hr over 30 Minutes Intravenous Every 8 hours 04/13/22 1503 04/13/22 1640   04/13/22 1700  ceFAZolin (ANCEF) IVPB 2g/100 mL premix  Status:  Discontinued        2 g 200 mL/hr over 30 Minutes Intravenous Every 8 hours 04/13/22 1640 04/16/22 0940   04/13/22 1500  clindamycin (CLEOCIN) IVPB 600 mg  Status:  Discontinued        600 mg 100 mL/hr over 30 Minutes Intravenous Every 8 hours 04/13/22 1412 04/13/22 1640   04/13/22 1415  vancomycin (VANCOCIN) IVPB 1000 mg/200 mL premix  Status:  Discontinued        1,000 mg 200 mL/hr over 60 Minutes Intravenous  Once 04/13/22 1412 04/13/22 1413   04/13/22 1415  piperacillin-tazobactam (ZOSYN) IVPB 3.375 g  Status:  Discontinued        3.375 g 100 mL/hr over 30 Minutes Intravenous  Once 04/13/22 1412 04/13/22 1503   04/13/22 1415  vancomycin (VANCOREADY) IVPB 2000 mg/400 mL        2,000 mg 200 mL/hr over 120 Minutes Intravenous  Once 04/13/22 1413 04/13/22 1842  04/13/22 1200  cefTRIAXone (ROCEPHIN) 2 g in sodium chloride 0.9 % 100 mL IVPB        2 g 200 mL/hr over 30 Minutes Intravenous  Once 04/13/22 1145 04/13/22 1312       Assessment/Plan:  Patient is a 52 year old male who was admitted with left lower extremity cellulitis and osteomyelitis of the left great toe.  -Status post left great toe amputation on 2/9 -Tissue culture with no growth to date -Toe culture swab with gram-positive rods and gram-positive cocci -Continue antibiotics per ID -Daily dressing changes with bacitracin, Xeroform, 4 x 4's, Kerlix, and Ace wrap -PT to work with the patient today to verify he does not have any additional needs.  Recommend heel weightbearing when walking -Postoperative boot provided to the patient -Patient will follow-up with me in 2 weeks -Appreciate hospitalist  recommendations   LOS: 3 days    [place] 04/16/2022

## 2022-04-16 NOTE — Evaluation (Signed)
Physical Therapy Evaluation Patient Details Name: Jon Chandler MRN: [id number] DOB: [birth year] Today's Date: 04/16/2022  History of Present Illness  Rector Belyeu Guyett-year-old male s/p Left great toe amputation on 04/15/22 with extensive history of presumed pain and swelling.  Patient reports  That he was treated with a round of antibiotics of clindamycin over past week with no improvement.   Stated erythema edema signs of infection started on her left great toe extending to his foot now down to his leg up to his thigh area.  Denies remembering any trauma or open wound to the left foot/toe area.  Pain swelling and redness has gotten worse since yesterday.   Clinical Impression  Patient demonstrates good return for ambulating in room/hallway without loss of balance using Axillary Crutches and wearing left foot walking shoe with understanding for use acknowledged.  Patient issued crutches and discharged to care of nursing staff for ambulation ad lib and with nursing staff as tolerated for length of stay.      Recommendations for follow up therapy are one component of a multi-disciplinary discharge planning process, led by the attending physician.  Recommendations may be updated based on patient status, additional functional criteria and insurance authorization.  Follow Up Recommendations Home health PT      Assistance Recommended at Discharge Set up Supervision/Assistance  Patient can return home with the following  A little help with walking and/or transfers;A little help with bathing/dressing/bathroom;Help with stairs or ramp for entrance;Assistance with cooking/housework    Equipment Recommendations Crutches;Other (comment)  Recommendations for Other Services       Functional Status Assessment Patient has had a recent decline in their functional status and demonstrates the ability to make significant improvements in function in a reasonable and predictable amount of time.      Precautions / Restrictions Precautions Precautions: Fall Restrictions Weight Bearing Restrictions: Yes LLE Weight Bearing: Weight bearing as tolerated Other Position/Activity Restrictions: post op shoe for left foot      Mobility  Bed Mobility Overal bed mobility: Modified Independent                  Transfers                   General transfer comment: good return for using Axillary crutches for sit to stands, transfers    Ambulation/Gait Ambulation/Gait assistance: Modified independent (Device/Increase time) Gait Distance (Feet): 100 Feet Assistive device: Crutches Gait Pattern/deviations: Decreased step length - right, Decreased step length - left, Decreased stance time - left, Decreased stride length, Antalgic Gait velocity: decreased     General Gait Details: good return for ambulating in room/hallway without loss of balance using Axillary Crutches with understanding for use acknowledged  Stairs            Wheelchair Mobility    Modified Rankin (Stroke Patients Only)       Balance Overall balance assessment: Modified Independent, Mild deficits observed, not formally tested                                           Pertinent Vitals/Pain Pain Assessment Pain Assessment: Faces Faces Pain Scale: Hurts a little bit Pain Location: left foot Pain Descriptors / Indicators: Sore Pain Intervention(s): Limited activity within patient's tolerance, Monitored during session, Repositioned    Home Living Family/patient expects to be discharged to:: Private residence Living Arrangements: Spouse/significant  other Available Help at Discharge: Family;Available 24 hours/day Type of Home: House Home Access: Stairs to enter Entrance Stairs-Rails: Psychiatric nurse of Steps: 2   Home Layout: One level Home Equipment: None      Prior Function Prior Level of Function : Needs assist       Physical Assist : ADLs  (physical);Mobility (physical) Mobility (physical): Transfers;Gait;Bed mobility;Stairs ADLs (physical): Dressing;Toileting;Bathing;IADLs Mobility Comments: Houshold ambulator leaning on walls and assisted by wife. ADLs Comments: Assisted for bathing, dressing, and toileting. IADL assist.     Hand Dominance   Dominant Hand: Right    Extremity/Trunk Assessment   Upper Extremity Assessment Upper Extremity Assessment: Overall WFL for tasks assessed    Lower Extremity Assessment Lower Extremity Assessment: Overall WFL for tasks assessed;LLE deficits/detail LLE Deficits / Details: grossly -4/5 LLE Sensation: WNL LLE Coordination: WNL    Cervical / Trunk Assessment Cervical / Trunk Assessment: Normal  Communication   Communication: No difficulties  Cognition Arousal/Alertness: Awake/alert Behavior During Therapy: WFL for tasks assessed/performed Overall Cognitive Status: Within Functional Limits for tasks assessed                                          General Comments      Exercises     Assessment/Plan    PT Assessment All further PT needs can be met in the next venue of care  PT Problem List Decreased strength;Decreased activity tolerance;Decreased balance;Decreased mobility       PT Treatment Interventions      PT Goals (Current goals can be found in the Care Plan section)  Acute Rehab PT Goals Patient Stated Goal: return home with family to assist PT Goal Formulation: With patient/family Time For Goal Achievement: 04/09/22 Potential to Achieve Goals: Good    Frequency       Co-evaluation               AM-PAC PT "6 Clicks" Mobility  Outcome Measure Help needed turning from your back to your side while in a flat bed without using bedrails?: None Help needed moving from lying on your back to sitting on the side of a flat bed without using bedrails?: None Help needed moving to and from a bed to a chair (including a wheelchair)?:  None Help needed standing up from a chair using your arms (e.g., wheelchair or bedside chair)?: None Help needed to walk in hospital room?: A Little Help needed climbing 3-5 steps with a railing? : A Little 6 Click Score: 22    End of Session   Activity Tolerance: Patient tolerated treatment well Patient left: in bed;with call bell/phone within reach;with family/visitor present Nurse Communication: Mobility status PT Visit Diagnosis: Unsteadiness on feet (R26.81);Other abnormalities of gait and mobility (R26.89);Muscle weakness (generalized) (M62.81)    Time: [id number] PT Time Calculation (min) (ACUTE ONLY): 20 min   Charges:   PT Evaluation $PT Eval Moderate Complexity: 1 Mod PT Treatments $Therapeutic Activity: 8-22 mins        1:55 PM, 04/16/22 Lonell Grandchild, MPT Physical Therapist with [facility] 336 [phone] office [phone] mobile phone

## 2022-04-16 NOTE — TOC Progression Note (Addendum)
Transition of Care [facility]) - Progression Note    Patient Details  Name: Jon Chandler MRN: [id number] Date of Birth: 1970/08/03  [facility]) CM/SW Contact  Ihor Gully, LCSW Phone Number: 04/16/2022, 4:47 PM  Clinical Narrative:    Patient will need HH. Has not PCP. Recently moved here from [place]. Plans to see PCP in [place]; however he has not yet made an appointment. Dr. Doy Hutching sent secure chat to identify if he can follow patient for [facility] needs until he establishes with PCP. Dr. Doy Hutching indicates that they are not allowed to perform this function anymore.      Barriers to Discharge: Continued Medical Work up  Expected Discharge Plan and Services                                               Social Determinants of Health (SDOH) Interventions SDOH Screenings   Food Insecurity: No Food Insecurity (04/13/2022)  Housing: Low Risk  (04/13/2022)  Transportation Needs: No Transportation Needs (04/13/2022)  Utilities: Not At Risk (04/13/2022)  Tobacco Use: High Risk (04/15/2022)    Readmission Risk Interventions     No data to display

## 2022-04-16 NOTE — Progress Notes (Signed)
PROGRESS NOTE    Jon Chandler  [id number] DOB: Nov 03, 1970 DOA: 04/13/2022 PCP: Merryl Hacker, No   Brief Narrative:    Jon Chandler male with extensive history of presumed pain and swelling.  Patient reports That he was treated with a round of antibiotics of clindamycin over past week with no improvement.  Stated erythema edema signs of infection started on her left great toe extending to his foot now down to his leg up to his thigh area. He has been admitted with osteomyelitis of his left great toe and ID is recommending treatment empirically with cefazolin and vancomycin for now.  ABI appears okay, but Doppler venous studies demonstrating DVT bilaterally.  He underwent left great toe amputation on 2/8 and is okay to start Eliquis today.  Assessment & Plan:   Principal Problem:   Osteomyelitis ([place]) Active Problems:   Lower extremity cellulitis   DMII (diabetes mellitus, type 2) ([place])  Assessment and Plan:   Osteomyelitis ([place]) Osteomyelitis left great toe -MRI of the foot: MPRESSION: 1. Active osteomyelitis of the proximal and distal phalanges of the great toe with suspected septic interphalangeal joint. 2. Bilobulated 2.4 by 0.7 by 1.0 cm lesion in the subcutaneous tissues of the dorsum of the foot possibly representing thrombosed venous structure or proteinaceous fluid in a cystic lesion.    -Status post left great toe amputation 2/9 with no complications. -Appreciate ID recommendations with Providencia noted thus far on tissue cultures with sensitivities pending.  Once these return hopefully can be discharged on oral antibiotics. -PT evaluation pending   Lower extremity cellulitis - Cellulitis left foot starting from the toe streaking to the foot bacterial leg up to his thigh area -Completed outpatient treatment with p.o. antibiotics of clindamycin with no improvement and actually worsened -Ruling out osteomyelitis pursuing MRI of the left foot   X-ray  of the left foot:  IMPRESSION: 1. New moderate to high-grade fragmentation/erosion of the distal phalanx of the great toe and the distal most aspect of the proximal phalanx of the great toe. Findings are highly suspicious for acute osteomyelitis     -Plan to continue current IV antibiotics for now until full sensitivities return  Bilateral lower extremity DVT -Okay to Eliquis per general surgery today   DMII (diabetes mellitus, type 2) ([facility]) - Currently not on any medications -Patient states he was told borderline diabetic  - checking CBG q. ACHS, SSI coverage -Diabetic diet -hemoglobin A1c 5.9%  Obesity BMI 31.03    DVT prophylaxis: Eliquis Code Status: Full Family Communication: Spouse at bedside 2/9 Disposition Plan:  Status is: Inpatient Remains inpatient appropriate because: Need for IV medications   Consultants:  ID General surgery  Procedures:  None  Antimicrobials:  Anti-infectives (From admission, onward)    Start     Dose/Rate Route Frequency Ordered Stop   04/16/22 1100  ceFEPIme (MAXIPIME) 2 g in sodium chloride 0.9 % 100 mL IVPB        2 g 200 mL/hr over 30 Minutes Intravenous Every 8 hours 04/16/22 0940     04/14/22 0400  vancomycin (VANCOREADY) IVPB 1750 mg/350 mL  Status:  Discontinued        1,750 mg 175 mL/hr over 120 Minutes Intravenous Every 12 hours 04/13/22 1451 04/16/22 1039   04/13/22 1700  meropenem (MERREM) 1 g in sodium chloride 0.9 % 100 mL IVPB  Status:  Discontinued        1 g 200 mL/hr over 30 Minutes Intravenous Every 8 hours 04/13/22  1503 04/13/22 1640   04/13/22 1700  ceFAZolin (ANCEF) IVPB 2g/100 mL premix  Status:  Discontinued        2 g 200 mL/hr over 30 Minutes Intravenous Every 8 hours 04/13/22 1640 04/16/22 0940   04/13/22 1500  clindamycin (CLEOCIN) IVPB 600 mg  Status:  Discontinued        600 mg 100 mL/hr over 30 Minutes Intravenous Every 8 hours 04/13/22 1412 04/13/22 1640   04/13/22 1415  vancomycin (VANCOCIN) IVPB  1000 mg/200 mL premix  Status:  Discontinued        1,000 mg 200 mL/hr over 60 Minutes Intravenous  Once 04/13/22 1412 04/13/22 1413   04/13/22 1415  piperacillin-tazobactam (ZOSYN) IVPB 3.375 g  Status:  Discontinued        3.375 g 100 mL/hr over 30 Minutes Intravenous  Once 04/13/22 1412 04/13/22 1503   04/13/22 1415  vancomycin (VANCOREADY) IVPB 2000 mg/400 mL        2,000 mg 200 mL/hr over 120 Minutes Intravenous  Once 04/13/22 1413 04/13/22 1842   04/13/22 1200  cefTRIAXone (ROCEPHIN) 2 g in sodium chloride 0.9 % 100 mL IVPB        2 g 200 mL/hr over 30 Minutes Intravenous  Once 04/13/22 1145 04/13/22 1312      Subjective: Patient seen and evaluated today with no new acute complaints or concerns. No acute concerns or events noted overnight.  Continues to have bilateral lower extremity pain, but this is stable.  Tolerated amputation well on 2/8.  Objective: Vitals:   04/15/22 1430 04/15/22 2144 04/16/22 0407 04/16/22 0412  BP: (!) 125/91 129/74  118/76  Pulse: 72 79  77  Resp: 18 18  20  $ Temp:  97.9 F (36.6 C)  98.5 F (36.9 C)  TempSrc:    Oral  SpO2: 96% 96%  100%  Weight:   113.9 kg   Height:        Intake/Output Summary (Last 24 hours) at 04/16/2022 1218 Last data filed at 04/16/2022 0900 Gross per 24 hour  Intake 2130 ml  Output 2940 ml  Net -810 ml   Filed Weights   04/15/22 0500 04/15/22 1207 04/16/22 0407  Weight: 114.4 kg 114.4 kg 113.9 kg    Examination:  General exam: Appears calm and comfortable  Respiratory system: Clear to auscultation. Respiratory effort normal. Cardiovascular system: S1 & S2 heard, RRR.  Gastrointestinal system: Abdomen is soft Central nervous system: Alert and awake Extremities: Bilateral lower extremity edema and erythema.  Dressings C/D/I on left foot. Skin: No significant lesions noted Psychiatry: Flat affect.    Data Reviewed: I have personally reviewed following labs and imaging studies  CBC: Recent Labs  Lab  04/13/22 1220 04/14/22 0436 04/15/22 0314 04/16/22 0400  WBC 11.3* 9.0 8.0 10.1  NEUTROABS 8.5*  --   --   --   HGB 13.4 10.1* 9.8* 9.8*  HCT 43.7 32.8* 32.0* 32.6*  MCV 79.3* 80.0 79.6* 81.1  PLT 613* 498* 531* [id number]*   Basic Metabolic Panel: Recent Labs  Lab 04/13/22 1220 04/14/22 0436 04/15/22 0314 04/16/22 0400  NA 135 132* 134* 136  K 3.6 3.4* 3.9 4.8  CL 97* 96* 100 102  CO2 23 25 27 27  $ GLUCOSE 94 90 94 146*  BUN 7 7 5* 7  CREATININE 0.66 0.59* 0.59* 0.61  CALCIUM 10.0 8.7* 8.5* 8.9  MG 2.2  --  2.0 2.1  PHOS 3.8  --   --   --  GFR: Estimated Creatinine Clearance: 147.1 mL/min (by C-G formula based on SCr of 0.61 mg/dL). Liver Function Tests: Recent Labs  Lab 04/13/22 1220  AST 27  ALT 25  ALKPHOS 114  BILITOT 0.5  PROT 9.9*  ALBUMIN 3.6   No results for input(s): "LIPASE", "AMYLASE" in the last 168 hours. No results for input(s): "AMMONIA" in the last 168 hours. Coagulation Profile: Recent Labs  Lab 04/13/22 1220 04/14/22 0436  INR 1.0 1.0   Cardiac Enzymes: No results for input(s): "CKTOTAL", "CKMB", "CKMBINDEX", "TROPONINI" in the last 168 hours. BNP (last 3 results) No results for input(s): "PROBNP" in the last 8760 hours. HbA1C: Recent Labs    04/13/22 1220  HGBA1C 5.9*   CBG: Recent Labs  Lab 04/15/22 1427 04/15/22 1643 04/15/22 2143 04/16/22 0741 04/16/22 1144  GLUCAP 86 191* 246* 134* 146*   Lipid Profile: No results for input(s): "CHOL", "HDL", "LDLCALC", "TRIG", "CHOLHDL", "LDLDIRECT" in the last 72 hours. Thyroid Function Tests: No results for input(s): "TSH", "T4TOTAL", "FREET4", "T3FREE", "THYROIDAB" in the last 72 hours. Anemia Panel: No results for input(s): "VITAMINB12", "FOLATE", "FERRITIN", "TIBC", "IRON", "RETICCTPCT" in the last 72 hours. Sepsis Labs: Recent Labs  Lab 04/13/22 1220 04/13/22 1351  LATICACIDVEN 1.8 0.9    Recent Results (from the past 240 hour(s))  Blood culture (routine x 2)     Status:  None (Preliminary result)   Collection Time: 04/13/22 12:20 PM   Specimen: BLOOD  Result Value Ref Range Status   Specimen Description BLOOD RIGHT ARM  Final   Special Requests   Final    BOTTLES DRAWN AEROBIC AND ANAEROBIC Blood Culture adequate volume   Culture   Final    NO GROWTH 2 DAYS Performed at [facility], [address] [place], [place] [address]    Report Status PENDING  Incomplete  Blood culture (routine x 2)     Status: None (Preliminary result)   Collection Time: 04/13/22 12:20 PM   Specimen: BLOOD  Result Value Ref Range Status   Specimen Description BLOOD RIGHT ASSIST CONTROL  Final   Special Requests   Final    BOTTLES DRAWN AEROBIC AND ANAEROBIC Blood Culture adequate volume   Culture   Final    NO GROWTH 2 DAYS Performed at [facility], [address] [place], [place] [address]    Report Status PENDING  Incomplete  Surgical pcr screen     Status: None   Collection Time: 04/14/22  1:03 PM   Specimen: Nasal Mucosa; Nasal Swab  Result Value Ref Range Status   MRSA, PCR NEGATIVE NEGATIVE Final   Staphylococcus aureus NEGATIVE NEGATIVE Final    Comment: (NOTE) The Xpert SA Assay (FDA approved for NASAL specimens in patients 24 years of age and older), is one component of a comprehensive surveillance program. It is not intended to diagnose infection nor to guide or monitor treatment. Performed at [facility], [address], [place], [place] [address]   Aerobic/Anaerobic Culture w Gram Stain (surgical/deep wound)     Status: None (Preliminary result)   Collection Time: 04/15/22  1:37 PM   Specimen: PATH Soft tissue  Result Value Ref Range Status   Specimen Description   Final    TOE LEFT Performed at [facility], [address], [place], [place] [address]    Special Requests   Final    NONE Performed at [facility], [address], [place],  [address]    Gram Stain   Final    RARE  WBC PRESENT,BOTH PMN AND MONONUCLEAR NO ORGANISMS SEEN     Culture   Final    CULTURE REINCUBATED FOR BETTER GROWTH Performed at [facility], [place] [address], [place], [place] [address]    Report Status PENDING  Incomplete  Aerobic/Anaerobic Culture w Gram Stain (surgical/deep wound)     Status: None (Preliminary result)   Collection Time: 04/15/22  1:37 PM   Specimen: PATH Other; Body Fluid  Result Value Ref Range Status   Specimen Description   Final    TOE LEFT Performed at [facility], [address] [place], [place] [address]    Special Requests   Final    NONE Performed at [facility], [address], [place], [place] [address]    Gram Stain   Final    NO WBC SEEN RARE GRAM POSITIVE RODS RARE GRAM POSITIVE COCCI    Culture   Final    FEW PROVIDENCIA RETTGERI SUSCEPTIBILITIES TO FOLLOW Performed at [facility], [place] [address], [place], [place] [address]    Report Status PENDING  Incomplete         Radiology Studies: No results found.      Scheduled Meds:  apixaban  10 mg Oral BID   Followed by   Derrill Memo ON 04/23/2022] apixaban  5 mg Oral BID   bacitracin   Topical Daily   chlorhexidine  15 mL Mouth/Throat Once   Or   mouth rinse  15 mL Mouth Rinse Once   insulin aspart  0-6 Units Subcutaneous TID WC   nicotine  21 mg Transdermal Daily   sodium chloride flush  3 mL Intravenous Q12H   sodium chloride flush  3 mL Intravenous Q12H   Continuous Infusions:  sodium chloride     ceFEPime (MAXIPIME) IV 2 g (04/16/22 1036)   lactated ringers       LOS: 3 days    Time spent: 35 minutes    Haeden Hudock Darleen Crocker, DO [facility]  If 7PM-7AM, please contact night-coverage [email or web address] 04/16/2022, 12:18 PM

## 2022-04-17 DIAGNOSIS — M86072 Acute hematogenous osteomyelitis, left ankle and foot: Secondary | ICD-10-CM | POA: Diagnosis not present

## 2022-04-17 LAB — BASIC METABOLIC PANEL
Anion gap: 9 (ref 5–15)
BUN: 11 mg/dL (ref 6–20)
CO2: 25 mmol/L (ref 22–32)
Calcium: 8.3 mg/dL — ABNORMAL LOW (ref 8.9–10.3)
Chloride: 104 mmol/L (ref 98–111)
Creatinine, Ser: 0.58 mg/dL — ABNORMAL LOW (ref 0.61–1.24)
GFR, Estimated: >60 mL/min (ref >60)
Glucose, Bld: 148 mg/dL — ABNORMAL HIGH (ref 70–99)
Potassium: 3.7 mmol/L (ref 3.5–5.1)
Sodium: 138 mmol/L (ref 135–145)

## 2022-04-17 LAB — GLUCOSE, CAPILLARY
Glucose-Capillary: 145 mg/dL — ABNORMAL HIGH (ref 70–99)
Glucose-Capillary: 117 mg/dL — ABNORMAL HIGH (ref 70–99)

## 2022-04-17 LAB — CBC
HCT: 30.6 % — ABNORMAL LOW (ref 39.0–52.0)
Hemoglobin: 9.2 g/dL — ABNORMAL LOW (ref 13.0–17.0)
MCH: 24.1 pg — ABNORMAL LOW (ref 26.0–34.0)
MCHC: 30.1 g/dL (ref 30.0–36.0)
MCV: 80.3 fL (ref 80.0–100.0)
Platelets: 619 10*3/uL — ABNORMAL HIGH (ref 150–400)
RBC: 3.81 MIL/uL — ABNORMAL LOW (ref 4.22–5.81)
RDW: 19.9 % — ABNORMAL HIGH (ref 11.5–15.5)
WBC: 7.5 10*3/uL (ref 4.0–10.5)
nRBC: 0 % (ref 0.0–0.2)

## 2022-04-17 LAB — MAGNESIUM: Magnesium: 1.8 mg/dL (ref 1.7–2.4)

## 2022-04-17 MED ORDER — APIXABAN 5 MG PO TABS: 10.0000 mg | ORAL_TABLET | Freq: Two times a day (BID) | ORAL | 0 refills | Status: DC

## 2022-04-17 MED ORDER — CIPROFLOXACIN HCL 500 MG PO TABS: 500.0000 mg | ORAL_TABLET | Freq: Two times a day (BID) | ORAL | 0 refills | Status: AC

## 2022-04-17 MED ORDER — APIXABAN 5 MG PO TABS: 5.0000 mg | ORAL_TABLET | Freq: Two times a day (BID) | ORAL | 0 refills | Status: AC

## 2022-04-17 MED ORDER — OXYCODONE HCL 5 MG PO TABS: 5.0000 mg | ORAL_TABLET | ORAL | 0 refills | Status: DC | PRN

## 2022-04-17 MED ORDER — BACITRACIN ZINC 500 UNIT/GM EX OINT: TOPICAL_OINTMENT | Freq: Every day | CUTANEOUS | 0 refills | Status: AC

## 2022-04-17 NOTE — Discharge Summary (Signed)
Physician Discharge Summary  Jon Chandler [id number] DOB: 06-04-1970 DOA: 04/13/2022  PCP: Pcp, No  Admit date: 04/13/2022  Discharge date: 04/17/2022  Admitted From:Home  Disposition:  Home  Recommendations for Outpatient Follow-up:  Follow up with PCP in 1-2 weeks list provided Daily dressing changes per general surgery with follow-up in 2 weeks Pain medications with oxycodone prescribed as needed with 20 tablets and 0 refills Continue Eliquis for DVT treatment Continue ciprofloxacin for 4 weeks as recommended per ID Continue other home medications as prior  Home Health:None  Equipment/Devices: None  Discharge Condition:Stable  CODE STATUS: Full  Diet recommendation: Heart Healthy  Brief/Interim Summary: Jon Chandler male with extensive history of presumed pain and swelling.  Patient reports That he was treated with a round of antibiotics of clindamycin over past week with no improvement.  Stated erythema edema signs of infection started on her left great toe extending to his foot now down to his leg up to his thigh area. He was admitted with osteomyelitis of his left great toe and ID recommending treatment empirically with cefazolin and vancomycin for now.  ABI appears okay, but Doppler venous studies demonstrating DVT bilaterally.  He underwent left great toe amputation on 2/8 and was started on Eliquis 2/9 for treatment of his DVT.  His tissue cultures have returned positive for Providencia that is susceptible to ciprofloxacin which she will be discharged on for 4 weeks as recommended per ID.  He has wound care recommendations per general surgery for daily dressing changes and will follow-up in 2 weeks.  He has been prescribed pain medications as well as Eliquis and ciprofloxacin.  No other acute events noted and he is stable for discharge.  He has been given a list to follow-up with new PCP outpatient.  Discharge Diagnoses:  Principal Problem:    Osteomyelitis ([place]) Active Problems:   Lower extremity cellulitis   DMII (diabetes mellitus, type 2) ([place])  Principal discharge diagnosis: Bilateral lower extremity DVT and cellulitis with left great toe osteomyelitis status post amputation 2/8.  Discharge Instructions  Discharge Instructions     Diet - low sodium heart healthy   Complete by: As directed    Discharge wound care:   Complete by: As directed    Daily dressing changes as in paperwork.   Increase activity slowly   Complete by: As directed    Leave dressing on - Keep it clean, dry, and intact until clinic visit   Complete by: As directed       Allergies as of 04/17/2022       Reactions   Penicillins    Other Reaction(s): Other - See Comments As infant unsure        Medication List     TAKE these medications    apixaban 5 MG Tabs tablet Commonly known as: ELIQUIS Take 2 tablets (10 mg total) by mouth 2 (two) times daily for 7 days.   apixaban 5 MG Tabs tablet Commonly known as: ELIQUIS Take 1 tablet (5 mg total) by mouth 2 (two) times daily. Start taking on: April 23, 2022   bacitracin ointment Apply topically daily.   ciprofloxacin 500 MG tablet Commonly known as: Cipro Take 1 tablet (500 mg total) by mouth 2 (two) times daily for 28 days.   ibuprofen 600 MG tablet Commonly known as: ADVIL Take 1 tablet (600 mg total) by mouth every 6 (six) hours as needed. What changed:  how much to take reasons to take this  oxyCODONE 5 MG immediate release tablet Commonly known as: Oxy IR/ROXICODONE Take 1 tablet (5 mg total) by mouth every 4 (four) hours as needed for moderate pain, severe pain or breakthrough pain.               Durable Medical Equipment  (From admission, onward)           Start     Ordered   04/15/22 1229  For home use only DME Crutches  Once        04/15/22 1228              Discharge Care Instructions  (From admission, onward)           Start      Ordered   04/17/22 0000  Leave dressing on - Keep it clean, dry, and intact until clinic visit        04/17/22 1224   04/17/22 0000  Discharge wound care:       Comments: Daily dressing changes as in paperwork.   04/17/22 1224            Follow-up Information     Pappayliou, Barnetta Chapel A, DO. Call.   Specialty: General Surgery Why: Call to schedule a follow up appointment in 2 weeks Contact information: [id number] [place] [facility] [address] [phone]                Allergies  Allergen Reactions   Penicillins     Other Reaction(s): Other - See Comments  As infant unsure    Consultations: General surgery ID   Procedures/Studies: US ARTERIAL ABI (SCREENING LOWER EXTREMITY)  Result Date: 04/14/2022 CLINICAL DATA:  Left first toe osteomyelitis and dorsal foot wound. History of diabetes. EXAM: NONINVASIVE PHYSIOLOGIC VASCULAR STUDY OF BILATERAL LOWER EXTREMITIES TECHNIQUE: Evaluation of both lower extremities were performed at rest, including calculation of ankle-brachial indices with single level Doppler, pressure and pulse volume recording. COMPARISON:  None Available. FINDINGS: Right ABI:  1.25 Left ABI:  1.19 Right Lower Extremity: Biphasic posterior tibial and monophasic dorsalis pedis waveforms. Left Lower Extremity: Biphasic posterior tibial and dorsalis pedis waveforms. 1.0-1.4 Normal IMPRESSION: Normal resting ankle-brachial indices with some distal waveform abnormalities. Findings suggest no significant arterial occlusive disease in either lower extremity but some component of probable tibial disease bilaterally. Electronically Signed   By: Aletta Edouard M.D.   On: 04/14/2022 12:57   US Venous Img Lower Bilateral (DVT)  Result Date: 04/14/2022 CLINICAL DATA:  9965 Edema 9965 [id number] Pain [id number] EXAM: BILATERAL LOWER EXTREMITY VENOUS DOPPLER ULTRASOUND TECHNIQUE: Gray-scale sonography with graded compression, as well as color Doppler and duplex ultrasound  were performed to evaluate the lower extremity deep venous systems from the level of the common femoral vein and including the common femoral, femoral, profunda femoral, popliteal and calf veins including the posterior tibial, peroneal and gastrocnemius veins when visible. The superficial great saphenous vein was also interrogated. Spectral Doppler was utilized to evaluate flow at rest and with distal augmentation maneuvers in the common femoral, femoral and popliteal veins. COMPARISON:  LEFT foot XRs, concurrent. FINDINGS: RIGHT LOWER EXTREMITY VENOUS Heterogeneously hypoechoic filling defect within the imaged portions of peripheral femoral vein and the paired peroneal veins, with incomplete compressibility. Normal compressibility of the RIGHT common femoral, popliteal, as well as the visualized portions of profunda femoral and great saphenous veins. OTHER No evidence of superficial thrombophlebitis or abnormal fluid collection. Limitations: none LEFT LOWER EXTREMITY VENOUS Heterogeneously hypoechoic filling defect within the imaged portions of  1 of the paired posterior tibial and peroneal veins, with incomplete compressibility. Normal compressibility of the LEFT common femoral, superficial femoral, and popliteal veins. Visualized portions of profunda femoral vein and great saphenous vein unremarkable. OTHER No evidence of superficial thrombophlebitis or abnormal fluid collection. Limitations: none Pathologically-enlarged LEFT inguinal lymph node. IMPRESSION: 1. Examination is POSITIVE for bilateral lower extremity DVT within the RIGHT femoral vein and LEFT calf veins, as above. 2. No evidence of superficial thrombophlebitis within either lower extremity. 3. Pathologically-enlarged LEFT inguinal lymph node, likely reactive. Michaelle Birks, MD [facility] Electronically Signed   By: Michaelle Birks M.D.   On: 04/14/2022 10:34   MR FOOT LEFT WO CONTRAST  Result  Date: 04/13/2022 CLINICAL DATA:  Foot infection, query osteomyelitis EXAM: MRI OF THE LEFT FOOT WITHOUT CONTRAST TECHNIQUE: Multiplanar, multisequence MR imaging of the left foot was performed. No intravenous contrast was administered. COMPARISON:  Radiographs 04/13/2022 FINDINGS: Bones/Joint/Cartilage Abnormal edema and bony destructive findings in the proximal and distal phalanges of the great toe compatible with active osteomyelitis. Probable septic interphalangeal joint with complex joint effusion. Faint and patchy edema signal in the lateral cuneiform and cuboid, likely degenerative. Subtle and likely degenerative marrow edema proximally in the second metatarsal. Ligaments Lisfranc ligament intact Muscles and Tendons Mild regional muscular atrophy. Low-grade diffuse muscular edema is likely neurogenic given the diffuse nature. Soft tissues Diffuse subcutaneous edema especially dorsally along the foot. Bilobulated 2.4 by 0.7 by 1.0 cm lesion in the subcutaneous tissues of the dorsum of the foot (image 25, series 7 with accentuated T1 signal, possibly representing thrombosed venous structure or proteinaceous fluid in a cystic lesion. It would be unusual for an abscess to have high T1 signal although given the surrounding edema and potential cellulitis, a small abscess is difficult to totally rule out. IMPRESSION: 1. Active osteomyelitis of the proximal and distal phalanges of the great toe with suspected septic interphalangeal joint. 2. Bilobulated 2.4 by 0.7 by 1.0 cm lesion in the subcutaneous tissues of the dorsum of the foot with accentuated T1 signal, possibly representing thrombosed venous structure or proteinaceous fluid in a cystic lesion. It would be unusual for an abscess to have high T1 signal although given the surrounding edema and potential cellulitis, a small abscess is difficult to totally rule out. 3. Diffuse subcutaneous edema especially dorsally along the foot. 4. Low-grade diffuse muscular  edema is likely neurogenic given the diffuse nature. Electronically Signed   By: Van Clines M.D.   On: 04/13/2022 14:54   DG Foot Complete Left  Result Date: 04/13/2022 CLINICAL DATA:  Infection. Cellulitis. Redness and swelling of left foot. EXAM: LEFT FOOT - COMPLETE 3+ VIEW COMPARISON:  Left foot radiographs 05/14/2016 FINDINGS: There is moderate to high-grade great toe soft tissue swelling. There is new moderate to high-grade fragmentation/erosion of the distal phalanx of the great toe. There is also high-grade erosion of the distal most aspect of the adjacent proximal phalanx of the great toe. There is mild diastasis of the cortical fragments. Minimal medial great toe metatarsophalangeal joint space narrowing. There is a prominent anterior process of the calcaneus that appears to articulate with the lateral aspect of the navicular, with mild-to-moderate joint space narrowing, subchondral sclerosis, and peripheral osteophytosis that is greatest at the plantar/lateral aspect of the joint, best seen on oblique view. Minimal dorsal talonavicular and navicular-cuneiform degenerative osteophytes are unchanged from prior. IMPRESSION: 1. New moderate to high-grade fragmentation/erosion of the distal phalanx of the great toe  and the distal most aspect of the proximal phalanx of the great toe. Findings are highly suspicious for acute osteomyelitis. 2. Moderate to high-grade great toe soft tissue swelling. 3. 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 Electronically Signed   By: Yvonne Kendall M.D.   On: 04/13/2022 12:17     Discharge Exam: Vitals:   04/16/22 2217 04/17/22 0611  BP: 138/79 (!) 142/92  Pulse: 75 73  Resp: 20 16  Temp: 98.9 F (37.2 C) 98.5 F (36.9 C)  SpO2: 92% 99%   Vitals:   04/16/22 0412 04/16/22 1643 04/16/22 2217 04/17/22 0611  BP: 118/76 136/73 138/79 (!) 142/92  Pulse: 77 80 75 73   Resp: 20  20 16  $ Temp: 98.5 F (36.9 C) 98 F (36.7 C) 98.9 F (37.2 C) 98.5 F (36.9 C)  TempSrc: Oral Oral Oral Oral  SpO2: 100% 97% 92% 99%  Weight:    115.2 kg  Height:        General: Pt is alert, awake, not in acute distress Cardiovascular: RRR, S1/S2 +, no rubs, no gallops Respiratory: CTA bilaterally, no wheezing, no rhonchi Abdominal: Soft, NT, ND, bowel sounds + Extremities: no edema, no cyanosis, dressings C/D/I to left lower extremity.  Erythema improved.    The results of significant diagnostics from this hospitalization (including imaging, microbiology, ancillary and laboratory) are listed below for reference.     Microbiology: Recent Results (from the past 240 hour(s))  Blood culture (routine x 2)     Status: None (Preliminary result)   Collection Time: 04/13/22 12:20 PM   Specimen: BLOOD  Result Value Ref Range Status   Specimen Description BLOOD RIGHT ARM  Final   Special Requests   Final    BOTTLES DRAWN AEROBIC AND ANAEROBIC Blood Culture adequate volume   Culture   Final    NO GROWTH 4 DAYS Performed at [facility], [address] [place], [place] [address]    Report Status PENDING  Incomplete  Blood culture (routine x 2)     Status: None (Preliminary result)   Collection Time: 04/13/22 12:20 PM   Specimen: BLOOD  Result Value Ref Range Status   Specimen Description BLOOD RIGHT ASSIST CONTROL  Final   Special Requests   Final    BOTTLES DRAWN AEROBIC AND ANAEROBIC Blood Culture adequate volume   Culture   Final    NO GROWTH 4 DAYS Performed at [facility], [address], [place],  [address]    Report Status PENDING  Incomplete  Surgical pcr screen     Status: None   Collection Time: 04/14/22  1:03 PM   Specimen: Nasal Mucosa; Nasal Swab  Result Value Ref Range Status   MRSA, PCR NEGATIVE NEGATIVE Final   Staphylococcus aureus NEGATIVE NEGATIVE Final    Comment: (NOTE) The Xpert SA Assay (FDA approved for NASAL specimens in  patients 81 years of age and older), is one component of a comprehensive surveillance program. It is not intended to diagnose infection nor to guide or monitor treatment. Performed at [facility], [address] [place],  [address]   Aerobic/Anaerobic Culture w Gram Stain (surgical/deep wound)     Status: None (Preliminary result)   Collection Time: 04/15/22  1:37 PM   Specimen: PATH Soft tissue  Result Value Ref Range Status   Specimen Description   Final    TOE LEFT Performed at [facility], [address], [place],  [address]  Special Requests   Final    NONE Performed at [facility], [address], [place], [place] [address]    Gram Stain   Final    RARE WBC PRESENT,BOTH PMN AND MONONUCLEAR NO ORGANISMS SEEN    Culture   Final    CULTURE REINCUBATED FOR BETTER GROWTH Performed at [facility], [place] [address], [place], [place] [address]    Report Status PENDING  Incomplete  Aerobic/Anaerobic Culture w Gram Stain (surgical/deep wound)     Status: None (Preliminary result)   Collection Time: 04/15/22  1:37 PM   Specimen: PATH Other; Body Fluid  Result Value Ref Range Status   Specimen Description   Final    TOE LEFT Performed at [facility], [address], [place], [place] [address]    Special Requests   Final    NONE Performed at [facility], [address] [place], [place] [address]    Gram Stain   Final    NO WBC SEEN RARE GRAM POSITIVE RODS RARE GRAM POSITIVE COCCI    Culture   Final    FEW PROVIDENCIA RETTGERI CULTURE REINCUBATED FOR BETTER GROWTH Performed at [facility], [place] [address], [place], [place] [address]    Report Status PENDING  Incomplete   Organism ID, Bacteria PROVIDENCIA RETTGERI  Final      Susceptibility   Providencia rettgeri - MIC*    AMPICILLIN RESISTANT Resistant     CEFAZOLIN >=64 RESISTANT Resistant     CEFEPIME <=0.12 SENSITIVE Sensitive     CEFTAZIDIME <=1 SENSITIVE Sensitive     CEFTRIAXONE  <=0.25 SENSITIVE Sensitive     CIPROFLOXACIN <=0.25 SENSITIVE Sensitive     GENTAMICIN <=1 SENSITIVE Sensitive     IMIPENEM 1 SENSITIVE Sensitive     TRIMETH/SULFA <=20 SENSITIVE Sensitive     AMPICILLIN/SULBACTAM <=2 SENSITIVE Sensitive     PIP/TAZO <=4 SENSITIVE Sensitive     * FEW PROVIDENCIA RETTGERI     Labs: BNP (last 3 results) No results for input(s): "BNP" in the last 8760 hours. Basic Metabolic Panel: Recent Labs  Lab 04/13/22 1220 04/14/22 0436 04/15/22 0314 04/16/22 0400 04/17/22 0411  NA 135 132* 134* 136 138  K 3.6 3.4* 3.9 4.8 3.7  CL 97* 96* 100 102 104  CO2 23 25 27 27 25  $ GLUCOSE 94 90 94 146* 148*  BUN 7 7 5* 7 11  CREATININE 0.66 0.59* 0.59* 0.61 0.58*  CALCIUM 10.0 8.7* 8.5* 8.9 8.3*  MG 2.2  --  2.0 2.1 1.8  PHOS 3.8  --   --   --   --    Liver Function Tests: Recent Labs  Lab 04/13/22 1220  AST 27  ALT 25  ALKPHOS 114  BILITOT 0.5  PROT 9.9*  ALBUMIN 3.6   No results for input(s): "LIPASE", "AMYLASE" in the last 168 hours. No results for input(s): "AMMONIA" in the last 168 hours. CBC: Recent Labs  Lab 04/13/22 1220 04/14/22 0436 04/15/22 0314 04/16/22 0400 04/17/22 0411  WBC 11.3* 9.0 8.0 10.1 7.5  NEUTROABS 8.5*  --   --   --   --   HGB 13.4 10.1* 9.8* 9.8* 9.2*  HCT 43.7 32.8* 32.0* 32.6* 30.6*  MCV 79.3* 80.0 79.6* 81.1 80.3  PLT 613* 498* 531* 610* 619*   Cardiac Enzymes: No results for input(s): "CKTOTAL", "CKMB", "CKMBINDEX", "TROPONINI" in the last 168 hours. BNP: Invalid input(s): "POCBNP" CBG: Recent Labs  Lab 04/16/22 1144 04/16/22 1642 04/16/22  2215 04/17/22 0742 04/17/22 1134  GLUCAP 146* 181* 137* 145* 117*   D-Dimer No results for input(s): "DDIMER" in the last 72 hours. Hgb A1c No results for input(s): "HGBA1C" in the last 72 hours. Lipid Profile No results for input(s): "CHOL", "HDL", "LDLCALC", "TRIG", "CHOLHDL", "LDLDIRECT" in the last 72 hours. Thyroid function studies No results for input(s):  "TSH", "T4TOTAL", "T3FREE", "THYROIDAB" in the last 72 hours.  Invalid input(s): "FREET3" Anemia work up No results for input(s): "VITAMINB12", "FOLATE", "FERRITIN", "TIBC", "IRON", "RETICCTPCT" in the last 72 hours. Urinalysis No results found for: "COLORURINE", "APPEARANCEUR", "LABSPEC", "PHURINE", "GLUCOSEU", "HGBUR", "BILIRUBINUR", "KETONESUR", "PROTEINUR", "UROBILINOGEN", "NITRITE", "LEUKOCYTESUR" Sepsis Labs Recent Labs  Lab 04/14/22 0436 04/15/22 0314 04/16/22 0400 04/17/22 0411  WBC 9.0 8.0 10.1 7.5   Microbiology Recent Results (from the past 240 hour(s))  Blood culture (routine x 2)     Status: None (Preliminary result)   Collection Time: 04/13/22 12:20 PM   Specimen: BLOOD  Result Value Ref Range Status   Specimen Description BLOOD RIGHT ARM  Final   Special Requests   Final    BOTTLES DRAWN AEROBIC AND ANAEROBIC Blood Culture adequate volume   Culture   Final    NO GROWTH 4 DAYS Performed at [facility], [address] [place], [place] [address]    Report Status PENDING  Incomplete  Blood culture (routine x 2)     Status: None (Preliminary result)   Collection Time: 04/13/22 12:20 PM   Specimen: BLOOD  Result Value Ref Range Status   Specimen Description BLOOD RIGHT ASSIST CONTROL  Final   Special Requests   Final    BOTTLES DRAWN AEROBIC AND ANAEROBIC Blood Culture adequate volume   Culture   Final    NO GROWTH 4 DAYS Performed at [facility], [address], [place], [place] [address]    Report Status PENDING  Incomplete  Surgical pcr screen     Status: None   Collection Time: 04/14/22  1:03 PM   Specimen: Nasal Mucosa; Nasal Swab  Result Value Ref Range Status   MRSA, PCR NEGATIVE NEGATIVE Final   Staphylococcus aureus NEGATIVE NEGATIVE Final    Comment: (NOTE) The Xpert SA Assay (FDA approved for NASAL specimens in patients 36 years of age and older), is one component of a comprehensive surveillance program. It is not intended to diagnose infection  nor to guide or monitor treatment. Performed at [facility], [address], [place], [place] [address]   Aerobic/Anaerobic Culture w Gram Stain (surgical/deep wound)     Status: None (Preliminary result)   Collection Time: 04/15/22  1:37 PM   Specimen: PATH Soft tissue  Result Value Ref Range Status   Specimen Description   Final    TOE LEFT Performed at [facility], [address] [place], [place] [address]    Special Requests   Final    NONE Performed at [facility], [address] [place], [place] [address]    Gram Stain   Final    RARE WBC PRESENT,BOTH PMN AND MONONUCLEAR NO ORGANISMS SEEN    Culture   Final    CULTURE REINCUBATED FOR BETTER GROWTH Performed at [facility], [place] [address] [place], [place] [address]    Report Status PENDING  Incomplete  Aerobic/Anaerobic Culture w Gram Stain (surgical/deep wound)     Status: None (Preliminary result)   Collection Time: 04/15/22  1:37 PM   Specimen: PATH Other; Body Fluid  Result Value Ref Range Status  Specimen Description   Final    TOE LEFT Performed at [facility], [address], [place], [place] [address]    Special Requests   Final    NONE Performed at [facility], [address], [place], [place] [address]    Gram Stain   Final    NO WBC SEEN RARE GRAM POSITIVE RODS RARE GRAM POSITIVE COCCI    Culture   Final    FEW PROVIDENCIA RETTGERI CULTURE REINCUBATED FOR BETTER GROWTH Performed at [facility], [place] [address] [place], [place] [address]    Report Status PENDING  Incomplete   Organism ID, Bacteria PROVIDENCIA RETTGERI  Final      Susceptibility   Providencia rettgeri - MIC*    AMPICILLIN RESISTANT Resistant     CEFAZOLIN >=64 RESISTANT Resistant     CEFEPIME <=0.12 SENSITIVE Sensitive     CEFTAZIDIME <=1 SENSITIVE Sensitive     CEFTRIAXONE <=0.25 SENSITIVE Sensitive     CIPROFLOXACIN <=0.25 SENSITIVE Sensitive     GENTAMICIN <=1 SENSITIVE Sensitive     IMIPENEM 1 SENSITIVE  Sensitive     TRIMETH/SULFA <=20 SENSITIVE Sensitive     AMPICILLIN/SULBACTAM <=2 SENSITIVE Sensitive     PIP/TAZO <=4 SENSITIVE Sensitive     * FEW PROVIDENCIA RETTGERI     Time coordinating discharge: 35 minutes  SIGNED:   Rodena Goldmann, DO [facility] 04/17/2022, 12:26 PM  If 7PM-7AM, please contact night-coverage [email or web address]

## 2022-04-17 NOTE — TOC Transition Note (Signed)
Transition of Care [facility]) - CM/SW Discharge Note   Patient Details  Name: Jon Chandler MRN: [id number] Date of Birth: 05-Oct-1970  Transition of Care [facility]) CM/SW Contact:  Iona Beard, [place] Phone Number: 04/17/2022, 12:09 PM   Clinical Narrative:    CSW spoke with pt to confirm he understands that due to no PCP he cannot get HH at this time. Pt states that he understands and has a PCP in [place] he plans to get established with. CSW explained that [facility] PT can be set up once pt is established with PCP office. Pt is understanding. CSW inquired about pts interest in outpatient PT. Pt states that he will wait to do [facility] once he gets a PCP. TOC signing off.   Final next level of care: Home/Self Care Barriers to Discharge: Barriers Resolved   Patient Goals and CMS Choice CMS Medicare.gov Compare Post Acute Care list provided to:: Patient Choice offered to / list presented to : Patient  Discharge Placement                         Discharge Plan and Services Additional resources added to the After Visit Summary for                                       Social Determinants of Health (SDOH) Interventions SDOH Screenings   Food Insecurity: No Food Insecurity (04/13/2022)  Housing: Low Risk  (04/13/2022)  Transportation Needs: No Transportation Needs (04/13/2022)  Utilities: Not At Risk (04/13/2022)  Tobacco Use: High Risk (04/15/2022)     Readmission Risk Interventions     No data to display

## 2022-04-17 NOTE — Progress Notes (Signed)
Pt and girlfriend who is bedside both need transportation home, discharge instructions given, [facility] called and waiting on call back for ETA.

## 2022-04-17 NOTE — Progress Notes (Addendum)
[facility] Progress Note  2 Days Post-Op  Subjective: Patient seen and examined.  He is resting comfortably in bed.  He denies significant pain associated with his left foot.  Dressing was changed this morning by nursing staff.  He is awaiting final cultures for antibiotic guidance prior to discharge.  Patient is wanting to be discharged home soon  Objective: Vital signs in last 24 hours: Temp:  [98 F (36.7 C)-98.9 F (37.2 C)] 98.5 F (36.9 C) (02/10 0611) Pulse Rate:  [73-80] 73 (02/10 0611) Resp:  [16-20] 16 (02/10 0611) BP: (136-142)/(73-92) 142/92 (02/10 0611) SpO2:  [92 %-99 %] 99 % (02/10 0611) Weight:  [115.2 kg] 115.2 kg (02/10 0611) Last BM Date : 04/12/22  Intake/Output from previous day: 02/09 0701 - 02/10 0700 In: 460 [P.O.:360; IV Piggyback:100] Out: 1150 [Urine:1150] Intake/Output this shift: No intake/output data recorded.  General appearance: alert, cooperative, and no distress Extremities: Left lower extremity with dressing in place, no strikethrough noted  Lab Results:  Recent Labs    04/16/22 0400 04/17/22 0411  WBC 10.1 7.5  HGB 9.8* 9.2*  HCT 32.6* 30.6*  PLT 610* 619*   BMET Recent Labs    04/16/22 0400 04/17/22 0411  NA 136 138  K 4.8 3.7  CL 102 104  CO2 27 25  GLUCOSE 146* 148*  BUN 7 11  CREATININE 0.61 0.58*  CALCIUM 8.9 8.3*   PT/INR No results for input(s): "LABPROT", "INR" in the last 72 hours.  Studies/Results: No results found.  Anti-infectives: Anti-infectives (From admission, onward)    Start     Dose/Rate Route Frequency Ordered Stop   04/16/22 1100  ceFEPIme (MAXIPIME) 2 g in sodium chloride 0.9 % 100 mL IVPB        2 g 200 mL/hr over 30 Minutes Intravenous Every 8 hours 04/16/22 0940     04/14/22 0400  vancomycin (VANCOREADY) IVPB 1750 mg/350 mL  Status:  Discontinued        1,750 mg 175 mL/hr over 120 Minutes Intravenous Every 12 hours 04/13/22 1451 04/16/22 1039   04/13/22 1700  meropenem  (MERREM) 1 g in sodium chloride 0.9 % 100 mL IVPB  Status:  Discontinued        1 g 200 mL/hr over 30 Minutes Intravenous Every 8 hours 04/13/22 1503 04/13/22 1640   04/13/22 1700  ceFAZolin (ANCEF) IVPB 2g/100 mL premix  Status:  Discontinued        2 g 200 mL/hr over 30 Minutes Intravenous Every 8 hours 04/13/22 1640 04/16/22 0940   04/13/22 1500  clindamycin (CLEOCIN) IVPB 600 mg  Status:  Discontinued        600 mg 100 mL/hr over 30 Minutes Intravenous Every 8 hours 04/13/22 1412 04/13/22 1640   04/13/22 1415  vancomycin (VANCOCIN) IVPB 1000 mg/200 mL premix  Status:  Discontinued        1,000 mg 200 mL/hr over 60 Minutes Intravenous  Once 04/13/22 1412 04/13/22 1413   04/13/22 1415  piperacillin-tazobactam (ZOSYN) IVPB 3.375 g  Status:  Discontinued        3.375 g 100 mL/hr over 30 Minutes Intravenous  Once 04/13/22 1412 04/13/22 1503   04/13/22 1415  vancomycin (VANCOREADY) IVPB 2000 mg/400 mL        2,000 mg 200 mL/hr over 120 Minutes Intravenous  Once 04/13/22 1413 04/13/22 1842   04/13/22 1200  cefTRIAXone (ROCEPHIN) 2 g in sodium chloride 0.9 % 100 mL IVPB  2 g 200 mL/hr over 30 Minutes Intravenous  Once 04/13/22 1145 04/13/22 1312       Assessment/Plan:  Patient is a 52 year old male who was admitted with left lower extremity cellulitis and osteomyelitis of the left great toe.  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

## 2022-04-18 LAB — CULTURE, BLOOD (ROUTINE X 2)
Culture: NO GROWTH
Culture: NO GROWTH
Special Requests: ADEQUATE
Special Requests: ADEQUATE

## 2022-04-19 LAB — SURGICAL PATHOLOGY

## 2022-04-20 LAB — AEROBIC/ANAEROBIC CULTURE W GRAM STAIN (SURGICAL/DEEP WOUND): Gram Stain: NONE SEEN

## 2022-04-21 ENCOUNTER — Encounter (HOSPITAL_COMMUNITY): Payer: Self-pay | Admitting: Surgery

## 2022-04-29 ENCOUNTER — Encounter: Payer: Self-pay | Admitting: Surgery

## 2022-04-29 ENCOUNTER — Ambulatory Visit (INDEPENDENT_AMBULATORY_CARE_PROVIDER_SITE_OTHER): Payer: 59 | Admitting: Surgery

## 2022-04-29 VITALS — BP 148/79 | HR 87 | Temp 98.6°F | Resp 14 | Ht 72.0 in | Wt 246.0 lb

## 2022-04-29 DIAGNOSIS — Z09 Encounter for follow-up examination after completed treatment for conditions other than malignant neoplasm: Secondary | ICD-10-CM

## 2022-05-03 NOTE — Progress Notes (Signed)
[facility] Note   HPI:  52 y.o. Male presents to clinic for post-op follow-up status post left great toe amputation on 2/8 for osteomyelitis.  Since being discharged from the hospital, patient has been doing well.  He complains of some phantom limb pain in addition to some pain on the dorsum of his foot.  His erythema to his left lower extremity has improved, though he still does have some associated swelling.  His wife has been doing daily dressing changes without any issues.  He denies any current incision site.  He denies any fevers or chills.  Review of Systems:  All other review of systems: otherwise negative   Vital Signs:  BP (!) 148/79   Pulse 87   Temp 98.6 F (37 C) (Oral)   Resp 14   Ht 6' (1.829 m)   Wt 246 lb (111.6 kg)   SpO2 95%   BMI 33.36 kg/m    Physical Exam:  Physical Exam Vitals reviewed.  Constitutional:      Appearance: Normal appearance.  Musculoskeletal:     Comments: Left great toe amputation site healing well with sutures in place, mild scabbing at incision site, 2+ pitting edema of left lower extremity with improving erythema  Neurological:     Mental Status: He is alert.     Laboratory studies: None   Imaging:  None   Assessment:  52 y.o. yo Male who presents for follow-up status left great toe amputation on 2/8  Plan:  -Patient's incision is healing well.  I will plan to leave the stitches in place and we will see him in another week to see if the stitches are ready to come out at that time -Continue with daily dressing changes.  Apply antibiotic ointment to the incision site, cover with Vaseline gauze, 4 x 4's, and then wrap foot with gauze roll, and Ace wrap. -Patient is currently on antibiotics and will be on antibiotics for 4 weeks postdischarge.  Defer to ID/PCP regarding antibiotic management -Follow up with me next week  All of the above recommendations were discussed with the patient and patient's family, and all  of patient's and family's questions were answered to their expressed satisfaction.  Graciella Freer, DO [facility] [address] [place], [place] [address] [phone] (office)

## 2022-05-05 ENCOUNTER — Encounter: Payer: 59 | Admitting: Surgery

## 2022-05-11 ENCOUNTER — Encounter: Payer: 59 | Admitting: Surgery

## 2022-05-13 ENCOUNTER — Encounter: Payer: Self-pay | Admitting: Surgery

## 2022-05-13 ENCOUNTER — Ambulatory Visit (INDEPENDENT_AMBULATORY_CARE_PROVIDER_SITE_OTHER): Payer: 59 | Admitting: Surgery

## 2022-05-13 VITALS — BP 117/75 | HR 88 | Temp 98.4°F | Resp 14 | Ht 72.0 in | Wt 231.0 lb

## 2022-05-13 DIAGNOSIS — Z09 Encounter for follow-up examination after completed treatment for conditions other than malignant neoplasm: Secondary | ICD-10-CM

## 2022-05-13 NOTE — Progress Notes (Signed)
[facility] Note   HPI:  53 y.o. Male presents to clinic for post-op follow-up status post left great toe amputation on 2/8 for osteomyelitis.  Overall he has been doing well.  His only complaint is some pain on the left lateral aspect of his foot, which started a couple days ago.  He thinks it secondary to the way he is weightbearing, as it hurts when he is up and moving around.  He denies any drainage or issues with his incision site.  Denies fevers and chills.  Review of Systems:  All other review of systems: otherwise negative   Vital Signs:  BP 117/75   Pulse 88   Temp 98.4 F (36.9 C) (Oral)   Resp 14   Ht 6' (1.829 m)   Wt 231 lb (104.8 kg)   SpO2 96%   BMI 31.33 kg/m    Physical Exam:  Physical Exam Vitals reviewed.  Constitutional:      Appearance: Normal appearance.  Skin:    Comments: Left great toe amputation site, healing well with sutures in place, scabbing at the lateral aspect of the incision, significantly improved lower extremity edema and erythema  Neurological:     Mental Status: He is alert.     Laboratory studies: None   Imaging:  None   Assessment:  52 y.o. yo Male who presents for follow-up status post left great toe amputation on 2/8  Plan:  -His incision is continuing to heal well, though there are a couple of areas where it has not fully healed -Some of his sutures were removed -Continue with daily dressing changes.  Apply antibiotic ointment to the incision, cover with Vaseline gauze, 4 x 4's, and then wrapped up with gauze roll and Ace wrap -Follow up with me next week for removal of remainder of sutures  All of the above recommendations were discussed with the patient and patient's family, and all of patient's and family's questions were answered to their expressed satisfaction.  Graciella Freer, DO [facility] [address] [place], [place] [address] [phone] (office)

## 2022-05-19 ENCOUNTER — Ambulatory Visit (INDEPENDENT_AMBULATORY_CARE_PROVIDER_SITE_OTHER): Payer: 59 | Admitting: Surgery

## 2022-05-19 ENCOUNTER — Encounter: Payer: Self-pay | Admitting: Surgery

## 2022-05-19 VITALS — BP 132/85 | HR 85 | Temp 98.1°F | Resp 16 | Ht 72.0 in | Wt 232.0 lb

## 2022-05-19 DIAGNOSIS — Z09 Encounter for follow-up examination after completed treatment for conditions other than malignant neoplasm: Secondary | ICD-10-CM

## 2022-05-19 NOTE — Progress Notes (Signed)
[facility] Note   HPI:  52 y.o. Male presents to clinic for post-op follow-up for suture removal status post left great toe amputation on 2/8 for osteomyelitis.  He continues to be doing well.  His wound has been healing.  His only complaint is if he needs to continue with the dressings, as it is causing his foot to sweat.  He denies any fevers or chills.  He denies any significant pain.  Review of Systems:  All other review of systems: otherwise negative   Vital Signs:  BP 132/85   Pulse 85   Temp 98.1 F (36.7 C) (Oral)   Resp 16   Ht 6' (1.829 m)   Wt 232 lb (105.2 kg)   SpO2 97%   BMI 31.46 kg/m    Physical Exam:  Physical Exam Vitals reviewed.  Constitutional:      Appearance: Normal appearance.  Skin:    Comments: Left great toe incision site healing well, couple areas with overlying scabbing, 3 sutures in place  Neurological:     Mental Status: He is alert.     Laboratory studies: None  Imaging:  None  Assessment:  52 y.o. yo Male who presents for follow-up for suture removal status post left great toe amputation on 2/8  Plan:  -The remainder of the stitches were removed -Advised him to continue wearing sandals and may leave the wound open to air until it completely heals without any scabbing.  Would only cover while outside to prevent dirt or bacteria from getting into the incision site -Follow-up as needed for issues related to tell  All of the above recommendations were discussed with the patient and patient's family, and all of patient's and family's questions were answered to their expressed satisfaction.  Graciella Freer, DO [facility] [address] [place], [place] [address] [phone] (office)

## 2022-07-08 DIAGNOSIS — I82409 Acute embolism and thrombosis of unspecified deep veins of unspecified lower extremity: Secondary | ICD-10-CM | POA: Diagnosis not present

## 2022-07-08 DIAGNOSIS — E118 Type 2 diabetes mellitus with unspecified complications: Secondary | ICD-10-CM | POA: Diagnosis not present

## 2022-07-08 DIAGNOSIS — E1169 Type 2 diabetes mellitus with other specified complication: Secondary | ICD-10-CM | POA: Diagnosis not present

## 2022-07-08 DIAGNOSIS — I10 Essential (primary) hypertension: Secondary | ICD-10-CM | POA: Diagnosis not present

## 2022-07-08 DIAGNOSIS — F17200 Nicotine dependence, unspecified, uncomplicated: Secondary | ICD-10-CM | POA: Diagnosis not present

## 2022-07-08 DIAGNOSIS — Z7901 Long term (current) use of anticoagulants: Secondary | ICD-10-CM | POA: Diagnosis not present

## 2022-07-12 ENCOUNTER — Telehealth: Payer: Self-pay | Admitting: *Deleted

## 2022-07-12 DIAGNOSIS — F17200 Nicotine dependence, unspecified, uncomplicated: Secondary | ICD-10-CM | POA: Diagnosis not present

## 2022-07-12 DIAGNOSIS — I82409 Acute embolism and thrombosis of unspecified deep veins of unspecified lower extremity: Secondary | ICD-10-CM | POA: Diagnosis not present

## 2022-07-12 DIAGNOSIS — S98119A Complete traumatic amputation of unspecified great toe, initial encounter: Secondary | ICD-10-CM | POA: Diagnosis not present

## 2022-07-12 DIAGNOSIS — E118 Type 2 diabetes mellitus with unspecified complications: Secondary | ICD-10-CM | POA: Diagnosis not present

## 2022-07-12 DIAGNOSIS — E1169 Type 2 diabetes mellitus with other specified complication: Secondary | ICD-10-CM | POA: Diagnosis not present

## 2022-07-12 DIAGNOSIS — Z7901 Long term (current) use of anticoagulants: Secondary | ICD-10-CM | POA: Diagnosis not present

## 2022-07-12 DIAGNOSIS — I10 Essential (primary) hypertension: Secondary | ICD-10-CM | POA: Diagnosis not present

## 2022-07-12 NOTE — Telephone Encounter (Signed)
Surgical Date: 04/15/2022 Procedure: AMPUTATION TOE, LEFT  Received call from patient [phone]~ telephone.   Patient reports that he has wound dehiscence to amputation site of great toe. States that PCP has treated with ABTx, but feels that he should F/U with surgeon.   Appointment scheduled.

## 2022-07-15 ENCOUNTER — Ambulatory Visit (INDEPENDENT_AMBULATORY_CARE_PROVIDER_SITE_OTHER): Payer: 59 | Admitting: Surgery

## 2022-07-15 ENCOUNTER — Encounter: Payer: Self-pay | Admitting: Surgery

## 2022-07-15 VITALS — BP 122/76 | HR 82 | Temp 97.5°F | Resp 14 | Ht 72.0 in | Wt 245.0 lb

## 2022-07-15 DIAGNOSIS — S91302A Unspecified open wound, left foot, initial encounter: Secondary | ICD-10-CM | POA: Diagnosis not present

## 2022-07-15 NOTE — Progress Notes (Signed)
[facility] Note   HPI:  52 y.o. Male presents to clinic for opened wound at his previous left great toe amputation site.  He states that the wound first appeared 1 week after his last follow-up visit with me in March.  He will occasionally have clear yellowish/serosanguineous drainage.  He denies any fevers or chills.  He will intermittently have pain at his amputation site, and notes swelling in his foot when he has been active throughout the day.  Review of Systems:  All other review of systems: otherwise negative   Vital Signs:  BP 122/76   Pulse 82   Temp (!) 97.5 F (36.4 C) (Oral)   Resp 14   Ht 6' (1.829 m)   Wt 245 lb (111.1 kg)   SpO2 99%   BMI 33.23 kg/m    Physical Exam:  Physical Exam Vitals reviewed.  Constitutional:      Appearance: Normal appearance.  Skin:    Comments: Left great toe amputation site with 1 cm circular wound, no significant erythema or induration, nontender to palpation  Neurological:     Mental Status: He is alert.     Laboratory studies: None  Imaging:  None  Assessment:  52 y.o. yo Male who presents with a wound dehiscence at his left great toe amputation site.  Status post amputation on 2/8.  Plan:  -Advised that the area does not appear infected as he has no significant induration or erythema.  Will obtain 2 view foot x-ray to verify no underlying osteomyelitis -Continue with applying antibiotic ointment to wound -Advised that appropriate control of his blood sugars and cessation of smoking will help with wound healing -Will refer patient to podiatry, as this wound has been present for over a month.  I also explained to the patient that if his x-ray does demonstrate osteomyelitis of his metatarsal, he would need to be managed by podiatry, as this is not an amputation that I could perform for him -Phone follow-up in 2 weeks  All of the above recommendations were discussed with the patient and patient's family, and  all of patient's and family's questions were answered to their expressed satisfaction.  Theophilus Kinds, DO [facility] [address] [place] [place], [place] [address] [phone] (office)

## 2022-07-20 ENCOUNTER — Other Ambulatory Visit: Payer: Self-pay | Admitting: *Deleted

## 2022-07-20 DIAGNOSIS — E1169 Type 2 diabetes mellitus with other specified complication: Secondary | ICD-10-CM

## 2022-07-20 DIAGNOSIS — M86072 Acute hematogenous osteomyelitis, left ankle and foot: Secondary | ICD-10-CM

## 2022-07-20 DIAGNOSIS — S91302A Unspecified open wound, left foot, initial encounter: Secondary | ICD-10-CM

## 2022-07-28 ENCOUNTER — Ambulatory Visit: Payer: 59 | Admitting: Podiatry

## 2022-07-28 ENCOUNTER — Ambulatory Visit (INDEPENDENT_AMBULATORY_CARE_PROVIDER_SITE_OTHER): Payer: 59 | Admitting: Surgery

## 2022-07-28 DIAGNOSIS — S91302D Unspecified open wound, left foot, subsequent encounter: Secondary | ICD-10-CM

## 2022-07-28 NOTE — Progress Notes (Signed)
[facility]  I am calling the patient for post operative evaluation.  The patient had a left great toe amputation on 2/8.  I was calling to see how the patient's wound was doing.  He did not answer the phone, so voicemail was left.  He is scheduled to see podiatry today.  It does not appear that he is undergone his left foot x-ray.  Will defer to podiatry regarding further management of the wound.  Patient may call the office as needed for any issues.  Will see the patient PRN.   Theophilus Kinds, DO [facility] [address] [place], [place] [address] [phone] (office)

## 2022-07-30 ENCOUNTER — Ambulatory Visit (HOSPITAL_COMMUNITY): Payer: 59 | Attending: Internal Medicine | Admitting: Physical Therapy

## 2022-08-04 ENCOUNTER — Ambulatory Visit (HOSPITAL_COMMUNITY): Payer: 59

## 2022-08-06 ENCOUNTER — Ambulatory Visit (HOSPITAL_COMMUNITY): Payer: 59

## 2022-08-06 ENCOUNTER — Ambulatory Visit (HOSPITAL_COMMUNITY): Payer: 59 | Admitting: Physical Therapy

## 2022-08-10 ENCOUNTER — Ambulatory Visit (HOSPITAL_COMMUNITY): Payer: 59 | Admitting: Physical Therapy

## 2022-08-13 ENCOUNTER — Ambulatory Visit (HOSPITAL_COMMUNITY): Payer: 59

## 2022-08-17 ENCOUNTER — Ambulatory Visit (HOSPITAL_COMMUNITY): Payer: 59 | Admitting: Physical Therapy

## 2022-08-19 ENCOUNTER — Ambulatory Visit (HOSPITAL_COMMUNITY): Payer: 59

## 2022-08-24 ENCOUNTER — Ambulatory Visit (HOSPITAL_COMMUNITY): Payer: 59

## 2022-08-26 ENCOUNTER — Ambulatory Visit (HOSPITAL_COMMUNITY): Payer: 59

## 2022-08-31 ENCOUNTER — Ambulatory Visit (HOSPITAL_COMMUNITY): Payer: 59

## 2022-09-02 ENCOUNTER — Ambulatory Visit (HOSPITAL_COMMUNITY): Payer: 59

## 2022-09-07 ENCOUNTER — Ambulatory Visit (HOSPITAL_COMMUNITY): Payer: 59

## 2022-09-10 ENCOUNTER — Ambulatory Visit (HOSPITAL_COMMUNITY): Payer: 59

## 2022-09-14 ENCOUNTER — Ambulatory Visit (HOSPITAL_COMMUNITY): Payer: 59 | Admitting: Physical Therapy
